# Patient Record
Sex: Male | Born: 1955
Health system: Southern US, Community
[De-identification: ages and names within clinical notes are randomized; demographics above are authoritative.]

## PROBLEM LIST (undated history)

## (undated) DIAGNOSIS — N3289 Other specified disorders of bladder: Secondary | ICD-10-CM

## (undated) DIAGNOSIS — I2510 Atherosclerotic heart disease of native coronary artery without angina pectoris: Secondary | ICD-10-CM

## (undated) DIAGNOSIS — E785 Hyperlipidemia, unspecified: Secondary | ICD-10-CM

## (undated) DIAGNOSIS — L409 Psoriasis, unspecified: Secondary | ICD-10-CM

## (undated) DIAGNOSIS — I219 Acute myocardial infarction, unspecified: Secondary | ICD-10-CM

## (undated) DIAGNOSIS — I10 Essential (primary) hypertension: Secondary | ICD-10-CM

## (undated) HISTORY — PX: JOINT REPLACEMENT: SHX530

## (undated) HISTORY — DX: Other specified disorders of bladder: N32.89

## (undated) HISTORY — DX: Psoriasis, unspecified: L40.9

## (undated) HISTORY — DX: Essential (primary) hypertension: I10

## (undated) HISTORY — PX: CORONARY ANGIOPLASTY WITH STENT PLACEMENT: SHX49

## (undated) HISTORY — DX: Hyperlipidemia, unspecified: E78.5

## (undated) HISTORY — PX: CYSTOSTOMY W/ BLADDER BIOPSY: SHX1431

## (undated) HISTORY — DX: Atherosclerotic heart disease of native coronary artery without angina pectoris: I25.10

---

## 2007-01-21 ENCOUNTER — Other Ambulatory Visit: Payer: Self-pay

## 2007-01-21 ENCOUNTER — Inpatient Hospital Stay: Payer: Self-pay | Admitting: Internal Medicine

## 2007-01-22 ENCOUNTER — Other Ambulatory Visit: Payer: Self-pay

## 2007-05-14 ENCOUNTER — Emergency Department: Payer: Self-pay | Admitting: Emergency Medicine

## 2007-05-24 ENCOUNTER — Emergency Department: Payer: Self-pay | Admitting: Emergency Medicine

## 2009-12-30 ENCOUNTER — Inpatient Hospital Stay: Payer: Self-pay | Admitting: Internal Medicine

## 2013-01-13 DIAGNOSIS — N3289 Other specified disorders of bladder: Secondary | ICD-10-CM

## 2013-01-13 HISTORY — DX: Other specified disorders of bladder: N32.89

## 2013-01-13 HISTORY — PX: PROSTATE SURGERY: SHX751

## 2013-09-21 ENCOUNTER — Ambulatory Visit: Payer: Self-pay | Admitting: Family Medicine

## 2013-09-29 ENCOUNTER — Ambulatory Visit: Payer: Self-pay | Admitting: Urology

## 2014-07-30 ENCOUNTER — Other Ambulatory Visit: Payer: Self-pay | Admitting: Family Medicine

## 2014-09-02 ENCOUNTER — Other Ambulatory Visit: Payer: Self-pay | Admitting: Family Medicine

## 2014-09-04 ENCOUNTER — Telehealth: Payer: Self-pay | Admitting: Family Medicine

## 2014-09-04 MED ORDER — ENALAPRIL MALEATE 10 MG PO TABS: 10.0000 mg | ORAL_TABLET | Freq: Two times a day (BID) | ORAL | Status: DC

## 2014-09-04 NOTE — Telephone Encounter (Signed)
Pt came in to get refill on enalapril. He is completely out and has scheduled appt for aug 30 for follow up. He would like it to go to [facility].

## 2014-09-08 DIAGNOSIS — I2510 Atherosclerotic heart disease of native coronary artery without angina pectoris: Secondary | ICD-10-CM | POA: Insufficient documentation

## 2014-09-08 DIAGNOSIS — I10 Essential (primary) hypertension: Secondary | ICD-10-CM | POA: Insufficient documentation

## 2014-09-08 DIAGNOSIS — L409 Psoriasis, unspecified: Secondary | ICD-10-CM | POA: Insufficient documentation

## 2014-09-08 DIAGNOSIS — E785 Hyperlipidemia, unspecified: Secondary | ICD-10-CM | POA: Insufficient documentation

## 2014-09-12 ENCOUNTER — Ambulatory Visit (INDEPENDENT_AMBULATORY_CARE_PROVIDER_SITE_OTHER): Payer: BLUE CROSS/BLUE SHIELD | Admitting: Family Medicine

## 2014-09-12 ENCOUNTER — Encounter: Payer: Self-pay | Admitting: Family Medicine

## 2014-09-12 VITALS — BP 131/82 | HR 65 | Temp 97.8°F | Ht 67.7 in | Wt 190.0 lb

## 2014-09-12 DIAGNOSIS — I10 Essential (primary) hypertension: Secondary | ICD-10-CM

## 2014-09-12 DIAGNOSIS — E785 Hyperlipidemia, unspecified: Secondary | ICD-10-CM | POA: Diagnosis not present

## 2014-09-12 DIAGNOSIS — Z0000 Encounter for general adult medical examination without abnormal findings: Secondary | ICD-10-CM

## 2014-09-12 DIAGNOSIS — I2510 Atherosclerotic heart disease of native coronary artery without angina pectoris: Secondary | ICD-10-CM

## 2014-09-12 DIAGNOSIS — I2583 Coronary atherosclerosis due to lipid rich plaque: Secondary | ICD-10-CM

## 2014-09-12 LAB — MICROSCOPIC EXAMINATION

## 2014-09-12 LAB — URINALYSIS, ROUTINE W REFLEX MICROSCOPIC
Bilirubin, UA: NEGATIVE
Leukocytes, UA: NEGATIVE
NITRITE UA: NEGATIVE
Specific Gravity, UA: 1.03 (ref 1.005–1.030)
Urobilinogen, Ur: 1 mg/dL (ref 0.2–1.0)
pH, UA: 5.5 (ref 5.0–7.5)

## 2014-09-12 MED ORDER — HYDROCHLOROTHIAZIDE 12.5 MG PO TABS: ORAL_TABLET | ORAL | Status: DC

## 2014-09-12 MED ORDER — BETAMETHASONE DIPROPIONATE 0.05 % EX CREA: TOPICAL_CREAM | Freq: Two times a day (BID) | CUTANEOUS | Status: DC

## 2014-09-12 MED ORDER — CLOPIDOGREL BISULFATE 75 MG PO TABS: 75.0000 mg | ORAL_TABLET | Freq: Every day | ORAL | Status: DC

## 2014-09-12 MED ORDER — FINASTERIDE 5 MG PO TABS: 5.0000 mg | ORAL_TABLET | Freq: Every day | ORAL | Status: DC

## 2014-09-12 MED ORDER — KETOCONAZOLE 2 % EX SHAM: 1.0000 "application " | MEDICATED_SHAMPOO | CUTANEOUS | Status: DC

## 2014-09-12 MED ORDER — SIMVASTATIN 40 MG PO TABS: 40.0000 mg | ORAL_TABLET | Freq: Every day | ORAL | Status: DC

## 2014-09-12 MED ORDER — ENALAPRIL MALEATE 10 MG PO TABS: 10.0000 mg | ORAL_TABLET | Freq: Every day | ORAL | Status: DC

## 2014-09-12 MED ORDER — BETAMETHASONE DIPROPIONATE AUG 0.05 % EX LOTN: TOPICAL_LOTION | Freq: Two times a day (BID) | CUTANEOUS | Status: DC

## 2014-09-12 MED ORDER — METOPROLOL TARTRATE 25 MG PO TABS: 12.5000 mg | ORAL_TABLET | Freq: Two times a day (BID) | ORAL | Status: DC

## 2014-09-12 NOTE — Assessment & Plan Note (Signed)
The current medical regimen is effective;  continue present plan and medications.  

## 2014-09-12 NOTE — Progress Notes (Signed)
BP 131/82 mmHg  Pulse 65  Temp(Src) 97.8 F (36.6 C)  Ht 5' 7.7" (1.72 m)  Wt 190 lb (86.183 kg)  BMI 29.13 kg/m2  SpO2 96%   Subjective:    Patient ID: Robert Zuniga, male    DOB: Mar 28, 1955, 59 y.o.   MRN: [id number]  HPI: Lott Seelbach is a 59 y.o. male  Chief Complaint  Patient presents with  . Annual Exam   Patient for physical today doing well taking medications without problems Having some petechial hemorrhages on the arms from Plavix and work but otherwise doing well no issues with medications Takes Tylenol PM at night for sleep which helps is wondering about different varieties which were discussed.  Relevant past medical, surgical, family and social history reviewed and updated as indicated. Interim medical history since our last visit reviewed. Allergies and medications reviewed and updated.  Review of Systems  Constitutional: Negative.   HENT: Negative.   Eyes: Negative.   Respiratory: Negative.   Cardiovascular: Negative.   Endocrine: Negative.   Musculoskeletal: Negative.   Skin: Negative.   Allergic/Immunologic: Negative.   Neurological: Negative.   Hematological: Negative.   Psychiatric/Behavioral: Negative.     Per HPI unless specifically indicated above     Objective:    BP 131/82 mmHg  Pulse 65  Temp(Src) 97.8 F (36.6 C)  Ht 5' 7.7" (1.72 m)  Wt 190 lb (86.183 kg)  BMI 29.13 kg/m2  SpO2 96%  Wt Readings from Last 3 Encounters:  09/12/14 190 lb (86.183 kg)  04/19/14 192 lb (87.091 kg)    Physical Exam  Constitutional: He is oriented to person, place, and time. He appears well-developed and well-nourished.  HENT:  Head: Normocephalic and atraumatic.  Right Ear: External ear normal.  Left Ear: External ear normal.  Eyes: Conjunctivae and EOM are normal. Pupils are equal, round, and reactive to light.  Neck: Normal range of motion. Neck supple.  Cardiovascular: Normal rate, regular rhythm, normal heart sounds and intact distal  pulses.   Pulmonary/Chest: Effort normal and breath sounds normal.  Abdominal: Soft. Bowel sounds are normal. There is no splenomegaly or hepatomegaly.  Genitourinary: Rectum normal, prostate normal and penis normal.  Musculoskeletal: Normal range of motion.  Neurological: He is alert and oriented to person, place, and time. He has normal reflexes.  Skin: No rash noted. No erythema.  Psychiatric: He has a normal mood and affect. His behavior is normal. Judgment and thought content normal.    No results found for this or any previous visit.    Assessment & Plan:   Problem List Items Addressed This Visit      Cardiovascular and Mediastinum   Hypertension - Primary    The current medical regimen is effective;  continue present plan and medications.       Relevant Medications   simvastatin (ZOCOR) 40 MG tablet   metoprolol tartrate (LOPRESSOR) 25 MG tablet   hydrochlorothiazide (HYDRODIURIL) 12.5 MG tablet   enalapril (VASOTEC) 10 MG tablet   Other Relevant Orders   Basic metabolic panel   CAD (coronary artery disease)    The current medical regimen is effective;  continue present plan and medications.       Relevant Medications   simvastatin (ZOCOR) 40 MG tablet   metoprolol tartrate (LOPRESSOR) 25 MG tablet   hydrochlorothiazide (HYDRODIURIL) 12.5 MG tablet   enalapril (VASOTEC) 10 MG tablet   clopidogrel (PLAVIX) 75 MG tablet   Other Relevant Orders   LP+ALT+AST Piccolo, Waived  Basic metabolic panel     Other   Hyperlipidemia    The current medical regimen is effective;  continue present plan and medications.       Relevant Medications   simvastatin (ZOCOR) 40 MG tablet   metoprolol tartrate (LOPRESSOR) 25 MG tablet   hydrochlorothiazide (HYDRODIURIL) 12.5 MG tablet   enalapril (VASOTEC) 10 MG tablet   Other Relevant Orders   LP+ALT+AST Piccolo, Waived    Other Visit Diagnoses    PE (physical exam), annual        Relevant Orders    Comprehensive metabolic  panel    Lipid panel    CBC with Differential/Platelet    TSH    Urinalysis, Routine w reflex microscopic (not at [facility])        Follow up plan: Return in about 6 months (around 03/13/2015), or if symptoms worsen or fail to improve, for med check.

## 2014-09-13 ENCOUNTER — Encounter: Payer: Self-pay | Admitting: Family Medicine

## 2014-09-13 LAB — CBC WITH DIFFERENTIAL/PLATELET
BASOS ABS: 0 10*3/uL (ref 0.0–0.2)
BASOS: 0 %
EOS (ABSOLUTE): 0.2 10*3/uL (ref 0.0–0.4)
Eos: 4 %
Hematocrit: 46.4 % (ref 37.5–51.0)
Hemoglobin: 16.2 g/dL (ref 12.6–17.7)
Immature Grans (Abs): 0 10*3/uL (ref 0.0–0.1)
Immature Granulocytes: 0 %
Lymphocytes Absolute: 1 10*3/uL (ref 0.7–3.1)
Lymphs: 26 %
MCH: 29.7 pg (ref 26.6–33.0)
MCHC: 34.9 g/dL (ref 31.5–35.7)
MCV: 85 fL (ref 79–97)
MONOS ABS: 0.4 10*3/uL (ref 0.1–0.9)
Monocytes: 10 %
NEUTROS ABS: 2.3 10*3/uL (ref 1.4–7.0)
NEUTROS PCT: 60 %
PLATELETS: 145 10*3/uL — AB (ref 150–379)
RBC: 5.46 x10E6/uL (ref 4.14–5.80)
RDW: 13 % (ref 12.3–15.4)
WBC: 3.8 10*3/uL (ref 3.4–10.8)

## 2014-09-13 LAB — LIPID PANEL
CHOL/HDL RATIO: 2.8 ratio (ref 0.0–5.0)
CHOLESTEROL TOTAL: 130 mg/dL (ref 100–199)
HDL: 47 mg/dL (ref >39)
LDL CALC: 70 mg/dL (ref 0–99)
TRIGLYCERIDES: 65 mg/dL (ref 0–149)
VLDL CHOLESTEROL CAL: 13 mg/dL (ref 5–40)

## 2014-09-13 LAB — COMPREHENSIVE METABOLIC PANEL
ALBUMIN: 4.2 g/dL (ref 3.6–4.8)
ALK PHOS: 95 IU/L (ref 39–117)
ALT: 22 IU/L (ref 0–44)
AST: 19 IU/L (ref 0–40)
Albumin/Globulin Ratio: 2 (ref 1.1–2.5)
BUN / CREAT RATIO: 17 (ref 10–22)
BUN: 12 mg/dL (ref 8–27)
Bilirubin Total: 0.8 mg/dL (ref 0.0–1.2)
CALCIUM: 9.3 mg/dL (ref 8.6–10.2)
CO2: 23 mmol/L (ref 18–29)
CREATININE: 0.71 mg/dL — AB (ref 0.76–1.27)
Chloride: 101 mmol/L (ref 97–108)
GFR calc Af Amer: 118 mL/min/{1.73_m2} (ref >59)
GFR, EST NON AFRICAN AMERICAN: 102 mL/min/{1.73_m2} (ref >59)
GLOBULIN, TOTAL: 2.1 g/dL (ref 1.5–4.5)
GLUCOSE: 109 mg/dL — AB (ref 65–99)
Potassium: 4.1 mmol/L (ref 3.5–5.2)
SODIUM: 142 mmol/L (ref 134–144)
Total Protein: 6.3 g/dL (ref 6.0–8.5)

## 2014-09-13 LAB — TSH: TSH: 0.886 u[IU]/mL (ref 0.450–4.500)

## 2014-11-29 ENCOUNTER — Other Ambulatory Visit: Payer: Self-pay | Admitting: Family Medicine

## 2015-03-14 ENCOUNTER — Ambulatory Visit (INDEPENDENT_AMBULATORY_CARE_PROVIDER_SITE_OTHER): Payer: BLUE CROSS/BLUE SHIELD | Admitting: Family Medicine

## 2015-03-14 ENCOUNTER — Encounter: Payer: Self-pay | Admitting: Family Medicine

## 2015-03-14 DIAGNOSIS — I10 Essential (primary) hypertension: Secondary | ICD-10-CM

## 2015-03-14 DIAGNOSIS — I2510 Atherosclerotic heart disease of native coronary artery without angina pectoris: Secondary | ICD-10-CM | POA: Diagnosis not present

## 2015-03-14 DIAGNOSIS — E785 Hyperlipidemia, unspecified: Secondary | ICD-10-CM

## 2015-03-14 DIAGNOSIS — I2583 Coronary atherosclerosis due to lipid rich plaque: Principal | ICD-10-CM

## 2015-03-14 LAB — LP+ALT+AST PICCOLO, WAIVED
ALT (SGPT) Piccolo, Waived: 31 U/L (ref 10–47)
AST (SGOT) PICCOLO, WAIVED: 37 U/L (ref 11–38)
CHOLESTEROL PICCOLO, WAIVED: 159 mg/dL (ref <200)
Chol/HDL Ratio Piccolo,Waive: 3.4 mg/dL
HDL CHOL PICCOLO, WAIVED: 47 mg/dL — AB (ref >59)
LDL CHOL CALC PICCOLO WAIVED: 87 mg/dL (ref <100)
Triglycerides Piccolo,Waived: 126 mg/dL (ref <150)
VLDL Chol Calc Piccolo,Waive: 25 mg/dL (ref <30)

## 2015-03-14 MED ORDER — SIMVASTATIN 40 MG PO TABS: ORAL_TABLET | ORAL | Status: DC

## 2015-03-14 NOTE — Assessment & Plan Note (Signed)
The current medical regimen is effective;  continue present plan and medications.  

## 2015-03-14 NOTE — Progress Notes (Signed)
BP 103/70 mmHg  Pulse 61  Temp(Src) 98.2 F (36.8 C)  Ht 5' 7.3" (1.709 m)  Wt 189 lb (85.73 kg)  BMI 29.35 kg/m2  SpO2 99%   Subjective:    Patient ID: Robert Zuniga, male    DOB: 03-06-1955, 60 y.o.   MRN: [id number]  HPI: Robert Zuniga is a 60 y.o. male  Chief Complaint  Patient presents with  . Hypertension  . Hyperlipidemia  . Coronary Artery Disease   Recheck medical problems no problems with blood pressure good control no side effects Cholesterol doing well with no problems issues No chest pain chest tightness or other cardiovascular type symptoms takes medications faithfully without side effects  Relevant past medical, surgical, family and social history reviewed and updated as indicated. Interim medical history since our last visit reviewed. Allergies and medications reviewed and updated.  Review of Systems  Constitutional: Negative.   Respiratory: Negative.   Cardiovascular: Negative.     Per HPI unless specifically indicated above     Objective:    BP 103/70 mmHg  Pulse 61  Temp(Src) 98.2 F (36.8 C)  Ht 5' 7.3" (1.709 m)  Wt 189 lb (85.73 kg)  BMI 29.35 kg/m2  SpO2 99%  Wt Readings from Last 3 Encounters:  03/14/15 189 lb (85.73 kg)  09/12/14 190 lb (86.183 kg)  04/19/14 192 lb (87.091 kg)    Physical Exam  Constitutional: He is oriented to person, place, and time. He appears well-developed and well-nourished. No distress.  HENT:  Head: Normocephalic and atraumatic.  Right Ear: Hearing normal.  Left Ear: Hearing normal.  Nose: Nose normal.  Eyes: Conjunctivae and lids are normal. Right eye exhibits no discharge. Left eye exhibits no discharge. No scleral icterus.  Cardiovascular: Normal rate, regular rhythm and normal heart sounds.   Pulmonary/Chest: Effort normal and breath sounds normal. No respiratory distress.  Musculoskeletal: Normal range of motion.  Neurological: He is alert and oriented to person, place, and time.  Skin: Skin is  intact. No rash noted.  Psychiatric: He has a normal mood and affect. His speech is normal and behavior is normal. Judgment and thought content normal. Cognition and memory are normal.    Results for orders placed or performed in visit on 03/14/15  LP+ALT+AST Piccolo, [employer]  Result Value Ref Range   ALT (SGPT) Piccolo, Waived 31 10 - 47 U/L   AST (SGOT) Piccolo, Waived 37 11 - 38 U/L   Cholesterol Piccolo, Waived 159 <200 mg/dL   HDL Chol Piccolo, Waived 47 (L) >59 mg/dL   Triglycerides Piccolo,Waived 126 <150 mg/dL   Chol/HDL Ratio Piccolo,Waive 3.4 mg/dL   LDL Chol Calc Piccolo Waived 87 <100 mg/dL   VLDL Chol Calc Piccolo,Waive 25 <30 mg/dL      Assessment & Plan:   Problem List Items Addressed This Visit      Cardiovascular and Mediastinum   Hypertension    The current medical regimen is effective;  continue present plan and medications.       Relevant Medications   simvastatin (ZOCOR) 40 MG tablet   CAD (coronary artery disease)    The current medical regimen is effective;  continue present plan and medications.       Relevant Medications   simvastatin (ZOCOR) 40 MG tablet     Other   Hyperlipidemia    The current medical regimen is effective;  continue present plan and medications.       Relevant Medications   simvastatin (ZOCOR) 40 MG  tablet       Follow up plan: Return in about 6 months (around 09/14/2015) for Physical Exam.

## 2015-03-15 ENCOUNTER — Encounter: Payer: Self-pay | Admitting: Family Medicine

## 2015-03-15 LAB — BASIC METABOLIC PANEL
BUN / CREAT RATIO: 18 (ref 10–22)
BUN: 15 mg/dL (ref 8–27)
CHLORIDE: 100 mmol/L (ref 96–106)
CO2: 27 mmol/L (ref 18–29)
CREATININE: 0.83 mg/dL (ref 0.76–1.27)
Calcium: 9.8 mg/dL (ref 8.6–10.2)
GFR calc non Af Amer: 96 mL/min/{1.73_m2} (ref >59)
GFR, EST AFRICAN AMERICAN: 111 mL/min/{1.73_m2} (ref >59)
Glucose: 101 mg/dL — ABNORMAL HIGH (ref 65–99)
POTASSIUM: 4.4 mmol/L (ref 3.5–5.2)
Sodium: 142 mmol/L (ref 134–144)

## 2015-06-30 ENCOUNTER — Other Ambulatory Visit: Payer: Self-pay | Admitting: Family Medicine

## 2015-08-01 ENCOUNTER — Other Ambulatory Visit: Payer: Self-pay | Admitting: Family Medicine

## 2015-09-05 ENCOUNTER — Encounter (INDEPENDENT_AMBULATORY_CARE_PROVIDER_SITE_OTHER): Payer: Self-pay

## 2015-09-23 ENCOUNTER — Other Ambulatory Visit: Payer: Self-pay | Admitting: Family Medicine

## 2015-09-23 DIAGNOSIS — I10 Essential (primary) hypertension: Secondary | ICD-10-CM

## 2015-10-04 ENCOUNTER — Encounter: Payer: BLUE CROSS/BLUE SHIELD | Admitting: Family Medicine

## 2015-10-16 ENCOUNTER — Other Ambulatory Visit: Payer: Self-pay | Admitting: Family Medicine

## 2015-10-16 DIAGNOSIS — I2583 Coronary atherosclerosis due to lipid rich plaque: Principal | ICD-10-CM

## 2015-10-16 DIAGNOSIS — I2510 Atherosclerotic heart disease of native coronary artery without angina pectoris: Secondary | ICD-10-CM

## 2015-10-16 NOTE — Telephone Encounter (Signed)
apt 

## 2015-10-17 ENCOUNTER — Encounter: Payer: Self-pay | Admitting: Family Medicine

## 2015-10-17 NOTE — Telephone Encounter (Signed)
MAC pt 

## 2015-10-17 NOTE — Telephone Encounter (Signed)
Letter sent.

## 2015-10-25 ENCOUNTER — Encounter: Payer: BLUE CROSS/BLUE SHIELD | Admitting: Family Medicine

## 2015-11-06 ENCOUNTER — Encounter: Payer: Self-pay | Admitting: Family Medicine

## 2015-11-06 ENCOUNTER — Ambulatory Visit (INDEPENDENT_AMBULATORY_CARE_PROVIDER_SITE_OTHER): Payer: BLUE CROSS/BLUE SHIELD | Admitting: Family Medicine

## 2015-11-06 VITALS — BP 135/84 | HR 67 | Temp 98.1°F | Ht 67.5 in | Wt 194.0 lb

## 2015-11-06 DIAGNOSIS — N400 Enlarged prostate without lower urinary tract symptoms: Secondary | ICD-10-CM

## 2015-11-06 DIAGNOSIS — I10 Essential (primary) hypertension: Secondary | ICD-10-CM

## 2015-11-06 DIAGNOSIS — Z0000 Encounter for general adult medical examination without abnormal findings: Secondary | ICD-10-CM

## 2015-11-06 DIAGNOSIS — J019 Acute sinusitis, unspecified: Secondary | ICD-10-CM

## 2015-11-06 DIAGNOSIS — E782 Mixed hyperlipidemia: Secondary | ICD-10-CM

## 2015-11-06 DIAGNOSIS — I2583 Coronary atherosclerosis due to lipid rich plaque: Secondary | ICD-10-CM | POA: Diagnosis not present

## 2015-11-06 DIAGNOSIS — I2510 Atherosclerotic heart disease of native coronary artery without angina pectoris: Secondary | ICD-10-CM | POA: Diagnosis not present

## 2015-11-06 LAB — MICROSCOPIC EXAMINATION

## 2015-11-06 LAB — URINALYSIS, ROUTINE W REFLEX MICROSCOPIC
Bilirubin, UA: NEGATIVE
KETONES UA: NEGATIVE
LEUKOCYTES UA: NEGATIVE
NITRITE UA: NEGATIVE
SPEC GRAV UA: 1.025 (ref 1.005–1.030)
Urobilinogen, Ur: 1 mg/dL (ref 0.2–1.0)
pH, UA: 6 (ref 5.0–7.5)

## 2015-11-06 MED ORDER — KETOCONAZOLE 2 % EX SHAM: 1.0000 "application " | MEDICATED_SHAMPOO | CUTANEOUS | 7 refills | Status: DC

## 2015-11-06 MED ORDER — ENALAPRIL MALEATE 10 MG PO TABS: 10.0000 mg | ORAL_TABLET | Freq: Every day | ORAL | 12 refills | Status: DC

## 2015-11-06 MED ORDER — BETAMETHASONE DIPROPIONATE 0.05 % EX CREA: TOPICAL_CREAM | Freq: Two times a day (BID) | CUTANEOUS | 7 refills | Status: DC

## 2015-11-06 MED ORDER — BETAMETHASONE DIPROPIONATE 0.05 % EX LOTN: TOPICAL_LOTION | CUTANEOUS | 2 refills | Status: DC

## 2015-11-06 MED ORDER — AZITHROMYCIN 250 MG PO TABS: ORAL_TABLET | ORAL | 0 refills | Status: DC

## 2015-11-06 MED ORDER — HYDROCHLOROTHIAZIDE 12.5 MG PO TABS: 12.5000 mg | ORAL_TABLET | Freq: Every day | ORAL | 12 refills | Status: DC

## 2015-11-06 MED ORDER — CLOPIDOGREL BISULFATE 75 MG PO TABS: 75.0000 mg | ORAL_TABLET | Freq: Every day | ORAL | 12 refills | Status: DC

## 2015-11-06 MED ORDER — SIMVASTATIN 40 MG PO TABS: ORAL_TABLET | ORAL | 12 refills | Status: DC

## 2015-11-06 MED ORDER — FINASTERIDE 5 MG PO TABS: 5.0000 mg | ORAL_TABLET | Freq: Every day | ORAL | 12 refills | Status: DC

## 2015-11-06 MED ORDER — METOPROLOL TARTRATE 25 MG PO TABS: 12.5000 mg | ORAL_TABLET | Freq: Two times a day (BID) | ORAL | 12 refills | Status: DC

## 2015-11-06 NOTE — Progress Notes (Signed)
BP 135/84   Pulse 67   Temp 98.1 F (36.7 C)   Ht 5' 7.5" (1.715 m)   Wt 194 lb (88 kg)   SpO2 98%   BMI 29.94 kg/m    Subjective:    Patient ID: Robert Zuniga, male    DOB: 29-Jun-1955, 60 y.o.   MRN: [id number]  HPI: Robert Zuniga is a 60 y.o. male  Chief Complaint  Patient presents with  . Annual Exam  Patient with over a week of head cold congestion drainage feeling facial pressure and bad some intermittent fevers. This not getting better has tried over-the-counter medications with no relief  Patient also follow-up cholesterol was taking Niaspan broke out in whelps 1 night and stopped it. Has had no more whelps since.  Blood pressure doing well no complaints with medications  BPH doing well no complaints with medications Takes medications faithfully.  Relevant past medical, surgical, family and social history reviewed and updated as indicated. Interim medical history since our last visit reviewed. Allergies and medications reviewed and updated.  Review of Systems  Constitutional: Positive for chills, diaphoresis, fatigue and fever.  HENT: Positive for congestion, postnasal drip, rhinorrhea, sinus pressure, sneezing and sore throat.   Eyes: Negative.   Respiratory: Positive for cough. Negative for shortness of breath and wheezing.   Cardiovascular: Negative.   Gastrointestinal: Negative.   Endocrine: Negative.   Genitourinary: Negative.   Musculoskeletal: Negative.   Skin: Negative.   Allergic/Immunologic: Negative.   Neurological: Negative.   Hematological: Negative.   Psychiatric/Behavioral: Negative.     Per HPI unless specifically indicated above     Objective:    BP 135/84   Pulse 67   Temp 98.1 F (36.7 C)   Ht 5' 7.5" (1.715 m)   Wt 194 lb (88 kg)   SpO2 98%   BMI 29.94 kg/m   Wt Readings from Last 3 Encounters:  11/06/15 194 lb (88 kg)  03/14/15 189 lb (85.7 kg)  09/12/14 190 lb (86.2 kg)    Physical Exam  Constitutional: He is  oriented to person, place, and time. He appears well-developed and well-nourished.  HENT:  Head: Normocephalic and atraumatic.  Right Ear: External ear normal.  Left Ear: External ear normal.  Mouth/Throat: Oropharyngeal exudate present.  Eyes: Conjunctivae and EOM are normal. Pupils are equal, round, and reactive to light.  Neck: Normal range of motion. Neck supple.  Cardiovascular: Normal rate, regular rhythm, normal heart sounds and intact distal pulses.   Pulmonary/Chest: Effort normal and breath sounds normal.  Abdominal: Soft. Bowel sounds are normal. There is no splenomegaly or hepatomegaly.  Genitourinary: Rectum normal and penis normal.  Genitourinary Comments: Prostate enlarged Lt ing hernia  Musculoskeletal: Normal range of motion.  Neurological: He is alert and oriented to person, place, and time. He has normal reflexes.  Skin: No rash noted. No erythema.  Psychiatric: He has a normal mood and affect. His behavior is normal. Judgment and thought content normal.    Results for orders placed or performed in visit on 03/14/15  LP+ALT+AST Piccolo, [employer]  Result Value Ref Range   ALT (SGPT) Piccolo, Waived 31 10 - 47 U/L   AST (SGOT) Piccolo, Waived 37 11 - 38 U/L   Cholesterol Piccolo, Waived 159 <200 mg/dL   HDL Chol Piccolo, Waived 47 (L) >59 mg/dL   Triglycerides Piccolo,Waived 126 <150 mg/dL   Chol/HDL Ratio Piccolo,Waive 3.4 mg/dL   LDL Chol Calc Piccolo Waived 87 <100 mg/dL   VLDL Chol  Calc Piccolo,Waive 25 <30 mg/dL  Basic metabolic panel  Result Value Ref Range   Glucose 101 (H) 65 - 99 mg/dL   BUN 15 8 - 27 mg/dL   Creatinine, Ser 0.83 0.76 - 1.27 mg/dL   GFR calc non Af Amer 96 >59 mL/min/1.73   GFR calc Af Amer 111 >59 mL/min/1.73   BUN/Creatinine Ratio 18 10 - 22   Sodium 142 134 - 144 mmol/L   Potassium 4.4 3.5 - 5.2 mmol/L   Chloride 100 96 - 106 mmol/L   CO2 27 18 - 29 mmol/L   Calcium 9.8 8.6 - 10.2 mg/dL      Assessment & Plan:   Problem List  Items Addressed This Visit      Cardiovascular and Mediastinum   Hypertension    The current medical regimen is effective;  continue present plan and medications.       Relevant Medications   simvastatin (ZOCOR) 40 MG tablet   metoprolol tartrate (LOPRESSOR) 25 MG tablet   hydrochlorothiazide (HYDRODIURIL) 12.5 MG tablet   enalapril (VASOTEC) 10 MG tablet   Other Relevant Orders   CBC with Differential/Platelet   Comprehensive metabolic panel   Urinalysis, Routine w reflex microscopic (not at [facility])   CAD (coronary artery disease) - Primary    The current medical regimen is effective;  continue present plan and medications.       Relevant Medications   simvastatin (ZOCOR) 40 MG tablet   metoprolol tartrate (LOPRESSOR) 25 MG tablet   hydrochlorothiazide (HYDRODIURIL) 12.5 MG tablet   enalapril (VASOTEC) 10 MG tablet   clopidogrel (PLAVIX) 75 MG tablet   Other Relevant Orders   Comprehensive metabolic panel     Respiratory   Acute sinusitis   Relevant Medications   azithromycin (ZITHROMAX) 250 MG tablet     Genitourinary   BPH (benign prostatic hyperplasia)   Relevant Medications   finasteride (PROSCAR) 5 MG tablet     Other   Hyperlipidemia    The current medical regimen is effective;  continue present plan and medications.       Relevant Medications   simvastatin (ZOCOR) 40 MG tablet   metoprolol tartrate (LOPRESSOR) 25 MG tablet   hydrochlorothiazide (HYDRODIURIL) 12.5 MG tablet   enalapril (VASOTEC) 10 MG tablet   Other Relevant Orders   Lipid Profile    Other Visit Diagnoses    PE (physical exam), annual       Relevant Orders   CBC with Differential/Platelet   Comprehensive metabolic panel   Urinalysis, Routine w reflex microscopic (not at [facility])   Lipid Profile   TSH   PSA       Follow up plan: Return in about 6 months (around 05/06/2016) for BMP,  Lipids, ALT, AST.

## 2015-11-06 NOTE — Assessment & Plan Note (Signed)
The current medical regimen is effective;  continue present plan and medications.  

## 2015-11-07 ENCOUNTER — Encounter: Payer: Self-pay | Admitting: Family Medicine

## 2015-11-07 LAB — CBC WITH DIFFERENTIAL/PLATELET
Basophils Absolute: 0 10*3/uL (ref 0.0–0.2)
Basos: 1 %
EOS (ABSOLUTE): 0.1 10*3/uL (ref 0.0–0.4)
EOS: 3 %
Hematocrit: 44.1 % (ref 37.5–51.0)
Hemoglobin: 15.6 g/dL (ref 12.6–17.7)
IMMATURE GRANULOCYTES: 0 %
Immature Grans (Abs): 0 10*3/uL (ref 0.0–0.1)
Lymphocytes Absolute: 0.8 10*3/uL (ref 0.7–3.1)
Lymphs: 21 %
MCH: 29.4 pg (ref 26.6–33.0)
MCHC: 35.4 g/dL (ref 31.5–35.7)
MCV: 83 fL (ref 79–97)
MONOS ABS: 0.4 10*3/uL (ref 0.1–0.9)
Monocytes: 11 %
NEUTROS PCT: 64 %
Neutrophils Absolute: 2.5 10*3/uL (ref 1.4–7.0)
PLATELETS: 152 10*3/uL (ref 150–379)
RBC: 5.31 x10E6/uL (ref 4.14–5.80)
RDW: 13.3 % (ref 12.3–15.4)
WBC: 3.9 10*3/uL (ref 3.4–10.8)

## 2015-11-07 LAB — LIPID PANEL
Chol/HDL Ratio: 3 ratio units (ref 0.0–5.0)
Cholesterol, Total: 116 mg/dL (ref 100–199)
HDL: 39 mg/dL — ABNORMAL LOW (ref >39)
LDL Calculated: 66 mg/dL (ref 0–99)
TRIGLYCERIDES: 54 mg/dL (ref 0–149)
VLDL Cholesterol Cal: 11 mg/dL (ref 5–40)

## 2015-11-07 LAB — COMPREHENSIVE METABOLIC PANEL
ALT: 16 IU/L (ref 0–44)
AST: 19 IU/L (ref 0–40)
Albumin/Globulin Ratio: 1.8 (ref 1.2–2.2)
Albumin: 4.1 g/dL (ref 3.6–4.8)
Alkaline Phosphatase: 102 IU/L (ref 39–117)
BUN/Creatinine Ratio: 19 (ref 10–24)
BUN: 15 mg/dL (ref 8–27)
Bilirubin Total: 0.6 mg/dL (ref 0.0–1.2)
CALCIUM: 9.3 mg/dL (ref 8.6–10.2)
CO2: 25 mmol/L (ref 18–29)
CREATININE: 0.78 mg/dL (ref 0.76–1.27)
Chloride: 100 mmol/L (ref 96–106)
GFR calc Af Amer: 113 mL/min/{1.73_m2} (ref >59)
GFR, EST NON AFRICAN AMERICAN: 97 mL/min/{1.73_m2} (ref >59)
Globulin, Total: 2.3 g/dL (ref 1.5–4.5)
Glucose: 105 mg/dL — ABNORMAL HIGH (ref 65–99)
Potassium: 4 mmol/L (ref 3.5–5.2)
Sodium: 139 mmol/L (ref 134–144)
Total Protein: 6.4 g/dL (ref 6.0–8.5)

## 2015-11-07 LAB — PSA: PROSTATE SPECIFIC AG, SERUM: 1.2 ng/mL (ref 0.0–4.0)

## 2015-11-07 LAB — TSH: TSH: 1.6 u[IU]/mL (ref 0.450–4.500)

## 2015-11-08 ENCOUNTER — Ambulatory Visit
Admission: RE | Admit: 2015-11-08 | Discharge: 2015-11-08 | Disposition: A | Discharge status: Home / Self Care | Payer: BLUE CROSS/BLUE SHIELD | Source: Ambulatory Visit | Attending: Family Medicine | Admitting: Family Medicine

## 2015-11-08 ENCOUNTER — Encounter: Payer: Self-pay | Admitting: Family Medicine

## 2015-11-08 ENCOUNTER — Telehealth: Payer: Self-pay | Admitting: Family Medicine

## 2015-11-08 ENCOUNTER — Ambulatory Visit (INDEPENDENT_AMBULATORY_CARE_PROVIDER_SITE_OTHER): Payer: BLUE CROSS/BLUE SHIELD | Admitting: Family Medicine

## 2015-11-08 VITALS — BP 107/73 | HR 79 | Temp 100.3°F | Wt 196.0 lb

## 2015-11-08 DIAGNOSIS — R509 Fever, unspecified: Secondary | ICD-10-CM | POA: Diagnosis not present

## 2015-11-08 DIAGNOSIS — J069 Acute upper respiratory infection, unspecified: Secondary | ICD-10-CM

## 2015-11-08 DIAGNOSIS — R05 Cough: Secondary | ICD-10-CM | POA: Insufficient documentation

## 2015-11-08 DIAGNOSIS — R059 Cough, unspecified: Secondary | ICD-10-CM

## 2015-11-08 LAB — INFLUENZA A AND B
Influenza A Ag, EIA: NEGATIVE
Influenza B Ag, EIA: NEGATIVE

## 2015-11-08 LAB — PLEASE NOTE:

## 2015-11-08 MED ORDER — LEVOFLOXACIN 750 MG PO TABS: 750.0000 mg | ORAL_TABLET | Freq: Every day | ORAL | 0 refills | Status: DC

## 2015-11-08 MED ORDER — DM-GUAIFENESIN ER 30-600 MG PO TB12: 1.0000 | ORAL_TABLET | Freq: Two times a day (BID) | ORAL | 0 refills | Status: DC

## 2015-11-08 NOTE — Patient Instructions (Signed)
Follow up as needed

## 2015-11-08 NOTE — Telephone Encounter (Signed)
Please call pt and let him know that his chest x-ray was negative for pneumonia. Continue as planned with d/c of z-pak and start on the 5 day levaquin. Follow up if no improvement in the next few days, go to UC or ER if severely worsening over weekend.

## 2015-11-08 NOTE — Progress Notes (Signed)
   BP 107/73   Pulse 79   Temp 100.3 F (37.9 C)   Wt 196 lb (88.9 kg)   SpO2 98%   BMI 30.24 kg/m    Subjective:    Patient ID: Robert Zuniga, male    DOB: Nov 25, 1955, 60 y.o.   MRN: [id number]  HPI: Robert Zuniga is a 60 y.o. male  Chief Complaint  Patient presents with  . URI    was seen Monday, given Zpack and has gotten worse. Chills, body aches, Sinus congestion, cough, fever   About 2 weeks of sinus congestion, now having fever, chills, body aches, and worsening sinus congestion and cough. Was given a z-pak Monday during his physical but has gotten worse since starting it. Denies SOB, CP, urinary symptoms, or abdominal pain/N/V/D.   Relevant past medical, surgical, family and social history reviewed and updated as indicated. Interim medical history since our last visit reviewed. Allergies and medications reviewed and updated.  Review of Systems  Constitutional: Positive for chills, diaphoresis, fatigue and fever.  HENT: Positive for congestion and sinus pressure.   Eyes: Negative.   Respiratory: Positive for cough.   Cardiovascular: Negative.   Gastrointestinal: Negative.   Genitourinary: Negative.   Musculoskeletal: Positive for myalgias.  Neurological: Negative.   Psychiatric/Behavioral: Negative.     Per HPI unless specifically indicated above     Objective:    BP 107/73   Pulse 79   Temp 100.3 F (37.9 C)   Wt 196 lb (88.9 kg)   SpO2 98%   BMI 30.24 kg/m   Wt Readings from Last 3 Encounters:  11/08/15 196 lb (88.9 kg)  11/06/15 194 lb (88 kg)  03/14/15 189 lb (85.7 kg)    Physical Exam  Constitutional: He is oriented to person, place, and time. He appears well-developed and well-nourished. No distress.  HENT:  Head: Atraumatic.  Right Ear: External ear normal.  Left Ear: External ear normal.  Nose: Nose normal.  Mouth/Throat: No oropharyngeal exudate.  Oropharynx erythematous  Eyes: Conjunctivae are normal. Pupils are equal, round, and  reactive to light. No scleral icterus.  Neck: Normal range of motion. Neck supple.  Cardiovascular: Normal rate, regular rhythm and normal heart sounds.   Pulmonary/Chest: Effort normal and breath sounds normal. No respiratory distress. He has no wheezes. He has no rales.  Musculoskeletal: Normal range of motion.  Lymphadenopathy:    He has no cervical adenopathy.  Neurological: He is alert and oriented to person, place, and time.  Skin: Skin is warm and dry.  Psychiatric: He has a normal mood and affect. His behavior is normal.  Nursing note and vitals reviewed.     Assessment & Plan:   Problem List Items Addressed This Visit    None    Visit Diagnoses    Upper respiratory tract infection, unspecified type    -  Primary   Flu test negative, lungs sounded clear, but given fever and worsening course will get CXR and start on levaquin. Await results. Precautions given.    Relevant Orders   Influenza a and b   DG Chest 2 View       Follow up plan: Return if symptoms worsen or fail to improve.

## 2015-11-08 NOTE — Telephone Encounter (Signed)
Patients wife notified

## 2016-01-16 ENCOUNTER — Encounter: Payer: Self-pay | Admitting: Family Medicine

## 2016-01-16 ENCOUNTER — Ambulatory Visit (INDEPENDENT_AMBULATORY_CARE_PROVIDER_SITE_OTHER): Payer: BLUE CROSS/BLUE SHIELD | Admitting: Family Medicine

## 2016-01-16 VITALS — BP 150/88 | HR 82 | Temp 98.9°F | Wt 192.0 lb

## 2016-01-16 DIAGNOSIS — B9789 Other viral agents as the cause of diseases classified elsewhere: Secondary | ICD-10-CM

## 2016-01-16 DIAGNOSIS — J069 Acute upper respiratory infection, unspecified: Secondary | ICD-10-CM | POA: Diagnosis not present

## 2016-01-16 DIAGNOSIS — R509 Fever, unspecified: Secondary | ICD-10-CM | POA: Diagnosis not present

## 2016-01-16 LAB — VERITOR FLU A/B WAIVED
INFLUENZA B: NEGATIVE
Influenza A: NEGATIVE

## 2016-01-16 MED ORDER — HYDROCOD POLST-CPM POLST ER 10-8 MG/5ML PO SUER: 5.0000 mL | Freq: Two times a day (BID) | ORAL | 0 refills | Status: DC | PRN

## 2016-01-16 MED ORDER — BENZONATATE 100 MG PO CAPS: 200.0000 mg | ORAL_CAPSULE | Freq: Three times a day (TID) | ORAL | 0 refills | Status: DC | PRN

## 2016-01-16 NOTE — Progress Notes (Signed)
BP (!) 150/88   Pulse 82   Temp 98.9 F (37.2 C)   Wt 192 lb (87.1 kg)   SpO2 98%   BMI 29.63 kg/m    Subjective:    Patient ID: Robert Zuniga, male    DOB: 1955-09-29, 61 y.o.   MRN: [id number]  HPI: Hagop Makely is a 61 y.o. male  Chief Complaint  Patient presents with  . URI    x 3 days. Chills, Fever, achey, chest congestion, headache, cough, sore throat. No ear ache.   Patient presents with 100 degree fevers, body aches, chest congestion, HA, productive cough, and sore throat. Denies CP, SOB, wheezing, ear pain. Taking mucinex and OTC cold medication. No sick contacts.   Past Medical History:  Diagnosis Date  . Bladder hemorrhage 2015  . CAD (coronary artery disease)   . Hyperlipidemia   . Hypertension   . Psoriasis    Social History   Social History  . Marital status: Married    Spouse name: N/A  . Number of children: N/A  . Years of education: N/A   Occupational History  . Not on file.   Social History Main Topics  . Smoking status: Former Smoker    Types: Cigarettes    Quit date: 09/12/1987  . Smokeless tobacco: Never Used  . Alcohol use No  . Drug use: No  . Sexual activity: Not on file   Other Topics Concern  . Not on file   Social History Narrative  . No narrative on file    Relevant past medical, surgical, family and social history reviewed and updated as indicated. Interim medical history since our last visit reviewed. Allergies and medications reviewed and updated.  Review of Systems  Constitutional: Positive for fever.  HENT: Positive for congestion.   Eyes: Negative.   Respiratory: Positive for cough.   Gastrointestinal: Negative.   Genitourinary: Negative.   Musculoskeletal: Positive for myalgias.  Neurological: Positive for headaches.  Psychiatric/Behavioral: Negative.     Per HPI unless specifically indicated above     Objective:    BP (!) 150/88   Pulse 82   Temp 98.9 F (37.2 C)   Wt 192 lb (87.1 kg)   SpO2 98%    BMI 29.63 kg/m   Wt Readings from Last 3 Encounters:  01/16/16 192 lb (87.1 kg)  11/08/15 196 lb (88.9 kg)  11/06/15 194 lb (88 kg)    Physical Exam  Constitutional: He is oriented to person, place, and time. He appears well-developed and well-nourished. No distress.  HENT:  Head: Atraumatic.  Right Ear: External ear normal.  Left Ear: External ear normal.  Mouth/Throat: No oropharyngeal exudate.  Oropharynx erythematous Nasal mucosa injected  Eyes: Conjunctivae are normal. Pupils are equal, round, and reactive to light.  Neck: Normal range of motion. Neck supple.  Cardiovascular: Normal rate and normal heart sounds.   Pulmonary/Chest: Effort normal and breath sounds normal.  Musculoskeletal: Normal range of motion.  Lymphadenopathy:    He has no cervical adenopathy.  Neurological: He is alert and oriented to person, place, and time.  Skin: Skin is warm and dry.  Psychiatric: He has a normal mood and affect. His behavior is normal.  Nursing note and vitals reviewed.     Assessment & Plan:   Problem List Items Addressed This Visit    None    Visit Diagnoses    Viral URI with cough    -  Primary   Rapid flu negative. Continue OTC  decongestants, supportive care. Tessalon and tussionex sent. Follow up if no improvement.    Relevant Orders   Influenza A & B (STAT) (Completed)       Follow up plan: No Follow-up on file.

## 2016-01-16 NOTE — Patient Instructions (Signed)
Follow up as needed

## 2016-01-17 ENCOUNTER — Encounter: Payer: Self-pay | Admitting: Family Medicine

## 2016-01-17 ENCOUNTER — Other Ambulatory Visit: Payer: Self-pay | Admitting: Family Medicine

## 2016-02-14 ENCOUNTER — Emergency Department: Payer: BLUE CROSS/BLUE SHIELD

## 2016-02-14 ENCOUNTER — Encounter: Payer: Self-pay | Admitting: Emergency Medicine

## 2016-02-14 ENCOUNTER — Observation Stay
Admission: EM | Admit: 2016-02-14 | Discharge: 2016-02-15 | Disposition: A | Discharge status: Home / Self Care | Payer: BLUE CROSS/BLUE SHIELD | Attending: Specialist | Admitting: Specialist

## 2016-02-14 DIAGNOSIS — Z7982 Long term (current) use of aspirin: Secondary | ICD-10-CM | POA: Diagnosis not present

## 2016-02-14 DIAGNOSIS — Z8249 Family history of ischemic heart disease and other diseases of the circulatory system: Secondary | ICD-10-CM | POA: Diagnosis not present

## 2016-02-14 DIAGNOSIS — L409 Psoriasis, unspecified: Secondary | ICD-10-CM | POA: Diagnosis not present

## 2016-02-14 DIAGNOSIS — Z833 Family history of diabetes mellitus: Secondary | ICD-10-CM | POA: Diagnosis not present

## 2016-02-14 DIAGNOSIS — Z79899 Other long term (current) drug therapy: Secondary | ICD-10-CM | POA: Diagnosis not present

## 2016-02-14 DIAGNOSIS — Z96643 Presence of artificial hip joint, bilateral: Secondary | ICD-10-CM | POA: Insufficient documentation

## 2016-02-14 DIAGNOSIS — Z818 Family history of other mental and behavioral disorders: Secondary | ICD-10-CM | POA: Insufficient documentation

## 2016-02-14 DIAGNOSIS — I10 Essential (primary) hypertension: Secondary | ICD-10-CM | POA: Insufficient documentation

## 2016-02-14 DIAGNOSIS — Z7902 Long term (current) use of antithrombotics/antiplatelets: Secondary | ICD-10-CM | POA: Diagnosis not present

## 2016-02-14 DIAGNOSIS — N400 Enlarged prostate without lower urinary tract symptoms: Secondary | ICD-10-CM | POA: Diagnosis not present

## 2016-02-14 DIAGNOSIS — Z955 Presence of coronary angioplasty implant and graft: Secondary | ICD-10-CM | POA: Diagnosis not present

## 2016-02-14 DIAGNOSIS — R0789 Other chest pain: Secondary | ICD-10-CM | POA: Diagnosis not present

## 2016-02-14 DIAGNOSIS — I2510 Atherosclerotic heart disease of native coronary artery without angina pectoris: Secondary | ICD-10-CM | POA: Diagnosis not present

## 2016-02-14 DIAGNOSIS — R0602 Shortness of breath: Secondary | ICD-10-CM | POA: Diagnosis not present

## 2016-02-14 DIAGNOSIS — I252 Old myocardial infarction: Secondary | ICD-10-CM | POA: Diagnosis not present

## 2016-02-14 DIAGNOSIS — Z887 Allergy status to serum and vaccine status: Secondary | ICD-10-CM | POA: Diagnosis not present

## 2016-02-14 DIAGNOSIS — Z87891 Personal history of nicotine dependence: Secondary | ICD-10-CM | POA: Insufficient documentation

## 2016-02-14 DIAGNOSIS — E785 Hyperlipidemia, unspecified: Secondary | ICD-10-CM | POA: Insufficient documentation

## 2016-02-14 DIAGNOSIS — I25119 Atherosclerotic heart disease of native coronary artery with unspecified angina pectoris: Secondary | ICD-10-CM | POA: Diagnosis not present

## 2016-02-14 DIAGNOSIS — R079 Chest pain, unspecified: Secondary | ICD-10-CM | POA: Diagnosis not present

## 2016-02-14 DIAGNOSIS — Z881 Allergy status to other antibiotic agents status: Secondary | ICD-10-CM | POA: Insufficient documentation

## 2016-02-14 DIAGNOSIS — Z791 Long term (current) use of non-steroidal anti-inflammatories (NSAID): Secondary | ICD-10-CM | POA: Insufficient documentation

## 2016-02-14 DIAGNOSIS — I200 Unstable angina: Secondary | ICD-10-CM | POA: Diagnosis not present

## 2016-02-14 DIAGNOSIS — Z885 Allergy status to narcotic agent status: Secondary | ICD-10-CM | POA: Diagnosis not present

## 2016-02-14 HISTORY — DX: Acute myocardial infarction, unspecified: I21.9

## 2016-02-14 LAB — COMPREHENSIVE METABOLIC PANEL
ALK PHOS: 93 U/L (ref 38–126)
ALT: 22 U/L (ref 17–63)
ANION GAP: 7 (ref 5–15)
AST: 24 U/L (ref 15–41)
Albumin: 4.1 g/dL (ref 3.5–5.0)
BILIRUBIN TOTAL: 1 mg/dL (ref 0.3–1.2)
BUN: 14 mg/dL (ref 6–20)
CALCIUM: 9.3 mg/dL (ref 8.9–10.3)
CO2: 25 mmol/L (ref 22–32)
CREATININE: 0.72 mg/dL (ref 0.61–1.24)
Chloride: 105 mmol/L (ref 101–111)
Glucose, Bld: 125 mg/dL — ABNORMAL HIGH (ref 65–99)
Potassium: 3.9 mmol/L (ref 3.5–5.1)
Sodium: 137 mmol/L (ref 135–145)
TOTAL PROTEIN: 6.8 g/dL (ref 6.5–8.1)

## 2016-02-14 LAB — CBC
HEMATOCRIT: 46.9 % (ref 40.0–52.0)
Hemoglobin: 16 g/dL (ref 13.0–18.0)
MCH: 29.2 pg (ref 26.0–34.0)
MCHC: 34.1 g/dL (ref 32.0–36.0)
MCV: 85.4 fL (ref 80.0–100.0)
Platelets: 112 10*3/uL — ABNORMAL LOW (ref 150–440)
RBC: 5.49 MIL/uL (ref 4.40–5.90)
RDW: 13.6 % (ref 11.5–14.5)
WBC: 7.8 10*3/uL (ref 3.8–10.6)

## 2016-02-14 LAB — TROPONIN I
Troponin I: <0.03 ng/mL (ref <0.03)
Troponin I: <0.03 ng/mL (ref <0.03)
Troponin I: <0.03 ng/mL (ref <0.03)
Troponin I: <0.03 ng/mL (ref <0.03)

## 2016-02-14 MED ORDER — ENOXAPARIN SODIUM 40 MG/0.4ML ~~LOC~~ SOLN
40.0000 mg | SUBCUTANEOUS | Status: DC
  Administered 2016-02-14: 40 mg via SUBCUTANEOUS
  Filled 2016-02-14: qty 0.4

## 2016-02-14 MED ORDER — ACETAMINOPHEN 325 MG PO TABS
650.0000 mg | ORAL_TABLET | Freq: Four times a day (QID) | ORAL | Status: DC | PRN
Start: 2016-02-14 — End: 2016-02-15
  Administered 2016-02-14 (×2): 650 mg via ORAL
  Filled 2016-02-14 (×2): qty 2

## 2016-02-14 MED ORDER — SODIUM CHLORIDE 0.9% FLUSH
3.0000 mL | Freq: Two times a day (BID) | INTRAVENOUS | Status: DC
  Administered 2016-02-14 – 2016-02-15 (×3): 3 mL via INTRAVENOUS

## 2016-02-14 MED ORDER — HYDROCHLOROTHIAZIDE 25 MG PO TABS
12.5000 mg | ORAL_TABLET | Freq: Every day | ORAL | Status: DC
Start: 2016-02-14 — End: 2016-02-15
  Administered 2016-02-14 – 2016-02-15 (×2): 12.5 mg via ORAL
  Filled 2016-02-14: qty 2
  Filled 2016-02-14: qty 1

## 2016-02-14 MED ORDER — ONDANSETRON HCL 4 MG PO TABS
4.0000 mg | ORAL_TABLET | Freq: Four times a day (QID) | ORAL | Status: DC | PRN
Start: 2016-02-14 — End: 2016-02-15

## 2016-02-14 MED ORDER — FINASTERIDE 5 MG PO TABS
5.0000 mg | ORAL_TABLET | Freq: Every day | ORAL | Status: DC
  Administered 2016-02-14: 5 mg via ORAL
  Filled 2016-02-14: qty 1

## 2016-02-14 MED ORDER — ALUM & MAG HYDROXIDE-SIMETH 200-200-20 MG/5ML PO SUSP: 30.0000 mL | Freq: Every day | ORAL | Status: DC | PRN

## 2016-02-14 MED ORDER — NITROGLYCERIN 2 % TD OINT
1.0000 [in_us] | TOPICAL_OINTMENT | Freq: Once | TRANSDERMAL | Status: AC
  Administered 2016-02-14: 1 [in_us] via TOPICAL
  Filled 2016-02-14: qty 1

## 2016-02-14 MED ORDER — ACETAMINOPHEN 650 MG RE SUPP
650.0000 mg | Freq: Four times a day (QID) | RECTAL | Status: DC | PRN
Start: 2016-02-14 — End: 2016-02-15

## 2016-02-14 MED ORDER — ONDANSETRON HCL 4 MG/2ML IJ SOLN: 4.0000 mg | Freq: Four times a day (QID) | INTRAMUSCULAR | Status: DC | PRN

## 2016-02-14 MED ORDER — ASPIRIN EC 81 MG PO TBEC
81.0000 mg | DELAYED_RELEASE_TABLET | Freq: Every day | ORAL | Status: DC
  Administered 2016-02-15: 81 mg via ORAL
  Filled 2016-02-14: qty 1

## 2016-02-14 MED ORDER — POLYETHYLENE GLYCOL 3350 17 G PO PACK
17.0000 g | PACK | Freq: Every day | ORAL | Status: DC
  Administered 2016-02-14 – 2016-02-15 (×2): 17 g via ORAL
  Filled 2016-02-14 (×2): qty 1

## 2016-02-14 MED ORDER — METOPROLOL TARTRATE 25 MG PO TABS
12.5000 mg | ORAL_TABLET | Freq: Two times a day (BID) | ORAL | Status: DC
  Administered 2016-02-14 – 2016-02-15 (×3): 12.5 mg via ORAL
  Filled 2016-02-14 (×2): qty 1
  Filled 2016-02-14: qty 2

## 2016-02-14 MED ORDER — GUAIFENESIN ER 600 MG PO TB12
600.0000 mg | ORAL_TABLET | Freq: Two times a day (BID) | ORAL | Status: DC
  Administered 2016-02-14 – 2016-02-15 (×2): 600 mg via ORAL
  Filled 2016-02-14 (×2): qty 1

## 2016-02-14 MED ORDER — FLUTICASONE PROPIONATE 50 MCG/ACT NA SUSP
2.0000 | Freq: Every day | NASAL | Status: DC
  Administered 2016-02-14 – 2016-02-15 (×2): 2 via NASAL
  Filled 2016-02-14: qty 16

## 2016-02-14 MED ORDER — ENOXAPARIN SODIUM 100 MG/ML ~~LOC~~ SOLN
1.0000 mg/kg | Freq: Once | SUBCUTANEOUS | Status: AC
  Administered 2016-02-14: 85 mg via SUBCUTANEOUS

## 2016-02-14 MED ORDER — SIMVASTATIN 40 MG PO TABS
40.0000 mg | ORAL_TABLET | Freq: Every day | ORAL | Status: DC
  Administered 2016-02-14: 40 mg via ORAL
  Filled 2016-02-14: qty 1

## 2016-02-14 MED ORDER — ENALAPRIL MALEATE 10 MG PO TABS
10.0000 mg | ORAL_TABLET | Freq: Every day | ORAL | Status: DC
  Administered 2016-02-14 – 2016-02-15 (×2): 10 mg via ORAL
  Filled 2016-02-14 (×2): qty 1

## 2016-02-14 MED ORDER — NITROGLYCERIN 0.4 MG SL SUBL: 0.4000 mg | SUBLINGUAL_TABLET | SUBLINGUAL | Status: DC | PRN

## 2016-02-14 MED ORDER — CLOPIDOGREL BISULFATE 75 MG PO TABS
75.0000 mg | ORAL_TABLET | Freq: Every day | ORAL | Status: DC
  Administered 2016-02-14 – 2016-02-15 (×2): 75 mg via ORAL
  Filled 2016-02-14 (×2): qty 1

## 2016-02-14 NOTE — Progress Notes (Signed)
Pt complaining of feeling "congested and stuffy", requesting RN to call MD.  Dr. Verdell Carmine paged and made aware of pt's status, new orders received.

## 2016-02-14 NOTE — H&P (Signed)
Robert Zuniga at [place] NAME: Robert Zuniga    MR#:  [id number]  DATE OF BIRTH:  1955-04-16  DATE OF ADMISSION:  02/14/2016  PRIMARY CARE PHYSICIAN: Golden Pop, MD   REQUESTING/REFERRING PHYSICIAN: Dr. Lenise Arena  CHIEF COMPLAINT:   Chief Complaint  Patient presents with  . Chest Pain    HISTORY OF PRESENT ILLNESS:  Robert Zuniga  is a 61 y.o. male with a known history of Coronary artery disease status post stent,  Hypertension, hyperlipidemia, history of psoriasis who presents to the hospital due to chest pain. Patient says that he went to work this morning around 5:30 AM and developed some right arm pain that radiated to his left arm and into the center of his chest. He also had associated shortness of breath and diaphoresis with it. He denies any nausea vomiting palpitations syncope. Patient says his symptoms are very similar to when he had his previous heart attack in 2005 and 2009 when he had to have cardiac stents placed. He was therefore but concerning came to the ER for further evaluation. Patient pain scale was about 8 out of 10 earlier this morning and currently down to about a 4. Hospitalist services were contacted further treatment and evaluation.  PAST MEDICAL HISTORY:   Past Medical History:  Diagnosis Date  . Bladder hemorrhage 2015  . CAD (coronary artery disease)   . Hyperlipidemia   . Hypertension   . MI (myocardial infarction)    X3  . Psoriasis     PAST SURGICAL HISTORY:   Past Surgical History:  Procedure Laterality Date  . CORONARY ANGIOPLASTY WITH STENT PLACEMENT    . CYSTOSTOMY W/ BLADDER BIOPSY    . JOINT REPLACEMENT Bilateral    hips  . PROSTATE SURGERY  2015   polyps -resolved    SOCIAL HISTORY:   Social History  Substance Use Topics  . Smoking status: Former Smoker    Packs/day: 1.00    Years: 2.00    Types: Cigarettes    Quit date: 09/12/1987  . Smokeless tobacco: Never Used  . Alcohol  use No    FAMILY HISTORY:   Family History  Problem Relation Age of Onset  . Diabetes Mother   . Dementia Mother   . Heart disease Father   . Cancer Sister   . Liver disease Sister   . Heart disease Maternal Grandfather   . Heart disease Paternal Grandfather     DRUG ALLERGIES:   Allergies  Allergen Reactions  . Morphine Nausea Only and Nausea And Vomiting  . Influenza Vaccine Recombinant Other (See Comments)    Joint pain  . Levaquin [Levofloxacin In D5w] Nausea And Vomiting  . Niaspan [Niacin Er] Hives    REVIEW OF SYSTEMS:   Review of Systems  Constitutional: Negative for fever and weight loss.  HENT: Negative for congestion, nosebleeds and tinnitus.   Eyes: Negative for blurred vision, double vision and redness.  Respiratory: Positive for shortness of breath. Negative for cough and hemoptysis.   Cardiovascular: Positive for chest pain. Negative for orthopnea, leg swelling and PND.  Gastrointestinal: Negative for abdominal pain, diarrhea, melena, nausea and vomiting.  Genitourinary: Negative for dysuria, hematuria and urgency.  Musculoskeletal: Negative for falls and joint pain.  Neurological: Negative for dizziness, tingling, sensory change, focal weakness, seizures, weakness and headaches.  Endo/Heme/Allergies: Negative for polydipsia. Does not bruise/bleed easily.  Psychiatric/Behavioral: Negative for depression and memory loss. The patient is not nervous/anxious.  MEDICATIONS AT HOME:   Prior to Admission medications   Medication Sig Start Date End Date Taking? Authorizing Provider  aspirin EC 81 MG tablet Take 81 mg by mouth daily.   Yes Historical Provider, MD  betamethasone dipropionate (DIPROLENE) 0.05 % cream Apply topically 2 (two) times daily. 11/06/15  Yes Guadalupe Maple, MD  betamethasone dipropionate 0.05 % lotion USE AS DIRECTED DONT USE FOR MORE THAN 2 WEEKS IN SAME AREA 11/06/15  Yes Guadalupe Maple, MD  calcium & magnesium carbonates  (MYLANTA) [id number] MG tablet Take 1 tablet by mouth daily.   Yes Historical Provider, MD  clopidogrel (PLAVIX) 75 MG tablet Take 1 tablet (75 mg total) by mouth daily. 11/06/15  Yes Guadalupe Maple, MD  enalapril (VASOTEC) 10 MG tablet Take 1 tablet (10 mg total) by mouth daily. 11/06/15  Yes Guadalupe Maple, MD  finasteride (PROSCAR) 5 MG tablet Take 1 tablet (5 mg total) by mouth at bedtime. Patient taking differently: Take 5 mg by mouth daily.  11/06/15  Yes Guadalupe Maple, MD  hydrochlorothiazide (HYDRODIURIL) 12.5 MG tablet Take 1 tablet (12.5 mg total) by mouth daily. 11/06/15  Yes Guadalupe Maple, MD  ketoconazole (NIZORAL) 2 % shampoo Apply 1 application topically 2 (two) times a week. 11/08/15  Yes Guadalupe Maple, MD  metoprolol tartrate (LOPRESSOR) 25 MG tablet Take 0.5 tablets (12.5 mg total) by mouth 2 (two) times daily. 11/06/15  Yes Guadalupe Maple, MD  naproxen sodium (ANAPROX) 220 MG tablet Take 220 mg by mouth as needed.   Yes Historical Provider, MD  polyethylene glycol powder (GLYCOLAX/MIRALAX) powder Take 17 g by mouth daily.   Yes Historical Provider, MD  simvastatin (ZOCOR) 40 MG tablet TAKE 1 TABLET ONCE EACH DAY ORAL 11/06/15  Yes Guadalupe Maple, MD  benzonatate (TESSALON) 100 MG capsule Take 2 capsules (200 mg total) by mouth 3 (three) times daily as needed. 01/16/16   Volney American, PA-C  chlorpheniramine-HYDROcodone [facility] ER) 10-8 MG/5ML SUER Take 5 mLs by mouth every 12 (twelve) hours as needed for cough. 01/16/16   Volney American, PA-C      VITAL SIGNS:  Blood pressure 117/77, pulse 84, temperature 98 F (36.7 C), temperature source Oral, resp. rate 18, height 5\' 8"  (1.727 m), weight 85.7 kg (189 lb), SpO2 93 %.  PHYSICAL EXAMINATION:  Physical Exam  GENERAL:  61 y.o.-year-old patient lying in the bed in no acute distress.  EYES: Pupils equal, round, reactive to light and accommodation. No scleral icterus. Extraocular muscles intact.   HEENT: Head atraumatic, normocephalic. Oropharynx and nasopharynx clear. No oropharyngeal erythema, moist oral mucosa  NECK:  Supple, no jugular venous distention. No thyroid enlargement, no tenderness.  LUNGS: Normal breath sounds bilaterally, no wheezing, rales, rhonchi. No use of accessory muscles of respiration.  CARDIOVASCULAR: S1, S2 RRR. No murmurs, rubs, gallops, clicks.  ABDOMEN: Soft, nontender, nondistended. Bowel sounds present. No organomegaly or mass.  EXTREMITIES: No pedal edema, cyanosis, or clubbing. + 2 pedal & radial pulses b/l.   NEUROLOGIC: Cranial nerves II through XII are intact. No focal Motor or sensory deficits appreciated b/l PSYCHIATRIC: The patient is alert and oriented x 3. Good affect.  SKIN: No obvious rash, lesion, or ulcer.   LABORATORY PANEL:   CBC  Recent Labs Lab 02/14/16 0736  WBC 7.8  HGB 16.0  HCT 46.9  PLT 112*   ------------------------------------------------------------------------------------------------------------------  Chemistries   Recent Labs Lab 02/14/16 0736  NA 137  K 3.9  CL 105  CO2 25  GLUCOSE 125*  BUN 14  CREATININE 0.72  CALCIUM 9.3  AST 24  ALT 22  ALKPHOS 93  BILITOT 1.0   ------------------------------------------------------------------------------------------------------------------  Cardiac Enzymes  Recent Labs Lab 02/14/16 0736  TROPONINI <0.03   ------------------------------------------------------------------------------------------------------------------  RADIOLOGY:  Dg Chest Port 1 View  Result Date: 02/14/2016 CLINICAL DATA:  Acute onset of mid chest pain radiating to the arms similar to that with previous myocardial infarctions. No nausea vomiting or diaphoresis. Intermittent shortness of breath. EXAM: PORTABLE CHEST 1 VIEW COMPARISON:  PA and lateral chest x-ray of November 08, 2015 FINDINGS: The lungs are adequately inflated and clear. The heart and pulmonary vascularity are normal.  The mediastinum is normal in width. There is faint calcification in the wall of the aortic arch. There is no pleural effusion. The bony thorax is unremarkable. IMPRESSION: There is no acute cardiopulmonary abnormality. Electronically Signed   By: David  Martinique M.D.   On: 02/14/2016 07:48     IMPRESSION AND PLAN:   61 year old male with past medical history of hypertension, hyperlipidemia, history of coronary artery disease status post stent placement presented to the hospital with chest pain.   1. Chest pain-patient does have significant risk factors for coronary disease given his previous history of cardiac stent placement. He says his pain is very typical for his previous MIs and 2005 and 2009. -Currently his cardiac markers are negative, his EKG shows no evidence of acute ST or T-wave changes. -We'll observe on telemetry, cycle his cardiac markers. Get a cardiology consult. -I will order a nuclear medicine stress test for tomorrow morning. -Continue aspirin, Plavix, beta blocker, statin, nitroglycerin, oxygen.  2. Essential hypertension-continue metoprolol, hydrochlorothiazide, enalapril.  3. Hyperlipidemia-continue simvastatin.  4. BPH-continue finasteride.    All the records are reviewed and case discussed with ED provider. Management plans discussed with the patient, family and they are in agreement.  CODE STATUS: Full  TOTAL TIME TAKING CARE OF THIS PATIENT: 45 minutes.    Henreitta Leber M.D on 02/14/2016 at 9:49 AM  Between 7am to 6pm - Pager - [phone]  After 6pm go to [email or web address] - password [email or web address]  Office  [phone]  CC: Primary care physician; Golden Pop, MD

## 2016-02-14 NOTE — ED Notes (Signed)
Pt okayed to eat by dr Jimmye Norman, given sandwich tray

## 2016-02-14 NOTE — Progress Notes (Signed)
Nutrition Brief Note  Patient identified on the Malnutrition Screening Tool (MST) Report  Wt Readings from Last 15 Encounters:  02/14/16 189 lb (85.7 kg)  01/16/16 192 lb (87.1 kg)  11/08/15 196 lb (88.9 kg)  11/06/15 194 lb (88 kg)  03/14/15 189 lb (85.7 kg)  09/12/14 190 lb (86.2 kg)  04/19/14 192 lb (87.1 kg)    Body mass index is 28.74 kg/m. Patient meets criteria for overweight based on current BMI.   Patient denies weight loss, poor appetite, issues chewing/swallowing/choking, or nausea/vomiting/diarrhea/constipation  Current diet order is heart healthy, patient is consuming approximately 100% of meals at this time. Labs and medications reviewed.   No nutrition interventions warranted at this time. If nutrition issues arise, please consult RD.   Robert Zuniga. Robert Bernabe, MS, RD LDN Inpatient Clinical Dietitian Pager [phone]

## 2016-02-14 NOTE — ED Triage Notes (Signed)
Pt c/o acute onset central chest pain with pain radiating from right arm to left. This is how pain was with previous MI (as ad 3). Denies nausea, vomiting or sweating but has had intermittent SHOB

## 2016-02-14 NOTE — ED Provider Notes (Signed)
[facility] Emergency Department Provider Note        Time seen: ----------------------------------------- 7:22 AM on 02/14/2016 -----------------------------------------    I have reviewed the triage vital signs and the nursing notes.   HISTORY  Chief Complaint No chief complaint on file.    HPI Robert Zuniga is a 61 y.o. male presents to the ER for acute onset chest pain this morning. Patient states he had pain and pressure in his chest, pain went up his right arm across his chest down to his left arm. He had sweats and nausea with it as well as shortness of breath. Patient reports a history of same with MI in the past. He has not had to take nitroglycerin since 2012 which was his last heart attack. Patient states he had nitroglycerin twice prior to arrival with aspirin and his pain is markedly better. He denies other complaints at this time.   Past Medical History:  Diagnosis Date  . Bladder hemorrhage 2015  . CAD (coronary artery disease)   . Hyperlipidemia   . Hypertension   . Psoriasis     Patient Active Problem List   Diagnosis Date Noted  . Acute sinusitis 11/06/2015  . BPH (benign prostatic hyperplasia) 11/06/2015  . Hyperlipidemia   . Hypertension   . CAD (coronary artery disease)   . Psoriasis     Past Surgical History:  Procedure Laterality Date  . CORONARY ANGIOPLASTY WITH STENT PLACEMENT    . CYSTOSTOMY W/ BLADDER BIOPSY    . JOINT REPLACEMENT Bilateral    hips  . PROSTATE SURGERY  2015   polyps -resolved    Allergies Morphine; Influenza vaccine recombinant; Levaquin [levofloxacin in d5w]; and Niaspan [niacin er]  Social History Social History  Substance Use Topics  . Smoking status: Former Smoker    Types: Cigarettes    Quit date: 09/12/1987  . Smokeless tobacco: Never Used  . Alcohol use No    Review of Systems Constitutional: Negative for fever. Cardiovascular: Positive for chest pain Respiratory: Positive  shortness of breath Gastrointestinal: Negative for abdominal pain, positive for nausea Genitourinary: Negative for dysuria. Musculoskeletal: Negative for back pain. Skin: Positive for sweats Neurological: Negative for headaches, focal weakness or numbness.  10-point ROS otherwise negative.  ____________________________________________   PHYSICAL EXAM:  VITAL SIGNS: ED Triage Vitals  Enc Vitals Group     BP      Pulse      Resp      Temp      Temp src      SpO2      Weight      Height      Head Circumference      Peak Flow      Pain Score      Pain Loc      Pain Edu?      Excl. in [place]?     Constitutional: Alert and oriented. Well appearing and in no distress. Eyes: Conjunctivae are normal. PERRL. Normal extraocular movements. ENT   Head: Normocephalic and atraumatic.   Nose: No congestion/rhinnorhea.   Mouth/Throat: Mucous membranes are moist.   Neck: No stridor. Cardiovascular: Normal rate, regular rhythm. No murmurs, rubs, or gallops. Respiratory: Normal respiratory effort without tachypnea nor retractions. Breath sounds are clear and equal bilaterally. No wheezes/rales/rhonchi. Gastrointestinal: Soft and nontender. Normal bowel sounds Musculoskeletal: Nontender with normal range of motion in all extremities. No lower extremity tenderness nor edema. Neurologic:  Normal speech and language. No gross focal neurologic deficits  are appreciated.  Skin:  Skin is warm, dry and intact. No rash noted. Psychiatric: Mood and affect are normal. Speech and behavior are normal.  ____________________________________________  EKG: Interpreted by me. Sinus rhythm rate 86 bpm, normal PR interval, normal QRS, normal QT, normal axis. Nonspecific T-wave changes.  ____________________________________________  ED COURSE:  Pertinent labs & imaging results that were available during my care of the patient were reviewed by me and considered in my medical decision making (see  chart for details). Patient presents to the ER with symptoms of unstable angina. We will assess with labs and imaging. Currently his pain is markedly improved.   Procedures ____________________________________________   LABS (pertinent positives/negatives)  Labs Reviewed  CBC - Abnormal; Notable for the following:       Result Value   Platelets 112 (*)    All other components within normal limits  COMPREHENSIVE METABOLIC PANEL - Abnormal; Notable for the following:    Glucose, Bld 125 (*)    All other components within normal limits  TROPONIN I    RADIOLOGY Images were viewed by me  Chest x-ray Reveals no acute abnormality ____________________________________________  FINAL ASSESSMENT AND PLAN  Chest pain, unstable angina  Plan: Patient with labs and imaging as dictated above. Patient presents with chest pain and symptomatology consistent with his prior MI and concerning today for unstable angina. Pain is currently better after aspirin and nitroglycerin. He'll receive a shot of Lovenox, will discuss with medicine for admission.   Earleen Newport, MD   Note: This note was generated in part or whole with voice recognition software. Voice recognition is usually quite accurate but there are transcription errors that can and very often do occur. I apologize for any typographical errors that were not detected and corrected.     Earleen Newport, MD 02/14/16 [phone]

## 2016-02-14 NOTE — ED Notes (Signed)
Pain free at this time still. NAD.

## 2016-02-14 NOTE — Consult Note (Signed)
[facility]  CARDIOLOGY CONSULT NOTE  Patient ID: Robert Zuniga MRN: [id number] DOB/AGE: [birth year] 61 y.o.  Admit date: 02/14/2016 Referring Physician Verdell Carmine Primary Physician [place] Primary Cardiologist Nehemiah Massed Reason for Consultation Chest pain  HPI: 61 year old male referred for chest pain. Patient has a history of coronary artery disease status post myocardial infarction with multiple stents in 2005 and 2009 with a recent cath in 2012, essential hypertension, hyperlipidemia. Patient was in his usual state of health the day of admission when he developed left and right arm pain with radiation to the central chest which was 6/10 in intensity, sharp in nature with associated nausea, diaphoresis, shortness of breath, which was similar to how he felt previously when he had an MI . Admission labs notable for negative serial troponins. EKG negative for acute ST-T wave changes. Currently, the patient denies chest pain, shortness of breath, palpitations, peripheral edema, or lightheadedness. He states he does feel weak.   Review of systems complete and found to be negative unless listed above     Past Medical History:  Diagnosis Date  . Bladder hemorrhage 2015  . CAD (coronary artery disease)   . Hyperlipidemia   . Hypertension   . MI (myocardial infarction)    X3  . Psoriasis     Past Surgical History:  Procedure Laterality Date  . CORONARY ANGIOPLASTY WITH STENT PLACEMENT    . CYSTOSTOMY W/ BLADDER BIOPSY    . JOINT REPLACEMENT Bilateral    hips  . PROSTATE SURGERY  2015   polyps -resolved    Prescriptions Prior to Admission  Medication Sig Dispense Refill Last Dose  . aspirin EC 81 MG tablet Take 81 mg by mouth daily.   02/13/2016 at am  . betamethasone dipropionate (DIPROLENE) 0.05 % cream Apply topically 2 (two) times daily. 30 g 7 Past Week at Unknown time  . betamethasone dipropionate 0.05 % lotion USE AS DIRECTED DONT USE FOR MORE THAN 2 WEEKS IN SAME AREA 60 mL 2 Past  Week at Unknown time  . calcium & magnesium carbonates (MYLANTA) [id number] MG tablet Take 1 tablet by mouth daily.   prn at prn  . clopidogrel (PLAVIX) 75 MG tablet Take 1 tablet (75 mg total) by mouth daily. 30 tablet 12 02/13/2016 at am  . enalapril (VASOTEC) 10 MG tablet Take 1 tablet (10 mg total) by mouth daily. 30 tablet 12 02/13/2016 at am  . finasteride (PROSCAR) 5 MG tablet Take 1 tablet (5 mg total) by mouth at bedtime. (Patient taking differently: Take 5 mg by mouth daily. ) 30 tablet 12 02/13/2016 at am  . hydrochlorothiazide (HYDRODIURIL) 12.5 MG tablet Take 1 tablet (12.5 mg total) by mouth daily. 30 tablet 12 02/13/2016 at am  . ketoconazole (NIZORAL) 2 % shampoo Apply 1 application topically 2 (two) times a week. 120 mL 7 Past Week at Unknown time  . metoprolol tartrate (LOPRESSOR) 25 MG tablet Take 0.5 tablets (12.5 mg total) by mouth 2 (two) times daily. 30 tablet 12 02/13/2016 at pm  . naproxen sodium (ANAPROX) 220 MG tablet Take 220 mg by mouth as needed.   prn at prn  . polyethylene glycol powder (GLYCOLAX/MIRALAX) powder Take 17 g by mouth daily.   prn at prn  . simvastatin (ZOCOR) 40 MG tablet TAKE 1 TABLET ONCE EACH DAY ORAL 30 tablet 12 02/13/2016 at am  . benzonatate (TESSALON) 100 MG capsule Take 2 capsules (200 mg total) by mouth 3 (three) times daily as needed. 45 capsule 0   .  chlorpheniramine-HYDROcodone (TUSSIONEX PENNKINETIC ER) 10-8 MG/5ML SUER Take 5 mLs by mouth every 12 (twelve) hours as needed for cough. 115 mL 0    Social History   Social History  . Marital status: Married    Spouse name: N/A  . Number of children: N/A  . Years of education: N/A   Occupational History  . Not on file.   Social History Main Topics  . Smoking status: Former Smoker    Packs/day: 1.00    Years: 2.00    Types: Cigarettes    Quit date: 09/12/1987  . Smokeless tobacco: Never Used  . Alcohol use No  . Drug use: No  . Sexual activity: Not on file   Other Topics Concern  . Not  on file   Social History Narrative  . No narrative on file    Family History  Problem Relation Age of Onset  . Diabetes Mother   . Dementia Mother   . Heart disease Father   . Cancer Sister   . Liver disease Sister   . Heart disease Maternal Grandfather   . Heart disease Paternal Grandfather       Review of systems complete and found to be negative unless listed above      PHYSICAL EXAM  General: Well developed, well nourished, in no acute distress HEENT:  Normocephalic and atramatic Neck:  No JVD.  Lungs: Clear bilaterally to auscultation and percussion. Heart: HRRR . Normal S1 and S2 without gallops or murmurs.  Abdomen: Bowel sounds are positive, abdomen soft and non-tender  Msk:  Back normal, normal gait. Normal strength and tone for age. Extremities: No clubbing, cyanosis or edema.   Neuro: Alert and oriented X 3. Psych:  Good affect, responds appropriately  Labs:   Lab Results  Component Value Date   WBC 7.8 02/14/2016   HGB 16.0 02/14/2016   HCT 46.9 02/14/2016   MCV 85.4 02/14/2016   PLT 112 (L) 02/14/2016    Recent Labs Lab 02/14/16 0736  NA 137  K 3.9  CL 105  CO2 25  BUN 14  CREATININE 0.72  CALCIUM 9.3  PROT 6.8  BILITOT 1.0  ALKPHOS 93  ALT 22  AST 24  GLUCOSE 125*   Lab Results  Component Value Date   TROPONINI <0.03 02/14/2016    Lab Results  Component Value Date   CHOL 116 11/06/2015   CHOL 159 03/14/2015   CHOL 130 09/12/2014   Lab Results  Component Value Date   HDL 39 (L) 11/06/2015   HDL 47 09/12/2014   Lab Results  Component Value Date   LDLCALC 66 11/06/2015   LDLCALC 70 09/12/2014   Lab Results  Component Value Date   TRIG 54 11/06/2015   TRIG 126 03/14/2015   TRIG 65 09/12/2014   Lab Results  Component Value Date   CHOLHDL 3.0 11/06/2015   CHOLHDL 2.8 09/12/2014   No results found for: LDLDIRECT    Radiology: Dg Chest Port 1 View  Result Date: 02/14/2016 CLINICAL DATA:  Acute onset of mid chest  pain radiating to the arms similar to that with previous myocardial infarctions. No nausea vomiting or diaphoresis. Intermittent shortness of breath. EXAM: PORTABLE CHEST 1 VIEW COMPARISON:  PA and lateral chest x-ray of November 08, 2015 FINDINGS: The lungs are adequately inflated and clear. The heart and pulmonary vascularity are normal. The mediastinum is normal in width. There is faint calcification in the wall of the aortic arch. There is no pleural effusion.  The bony thorax is unremarkable. IMPRESSION: There is no acute cardiopulmonary abnormality. Electronically Signed   By: David  Martinique M.D.   On: 02/14/2016 07:48    EKG: Normal sinus rhythm, rate 65 bpm  ASSESSMENT AND PLAN:  1. Chest pain with typical and atypical features with negative serial troponin. The patient is now chest-pain free.   Recommendations: 1. Agree with current therapy 2. Nuclear stress test tomorrow morning. 3. Further recommendations pending patient's course and results of stress test   Signed: Clabe Seal, PA-C 02/14/2016, 6:23 PM

## 2016-02-15 ENCOUNTER — Observation Stay: Payer: BLUE CROSS/BLUE SHIELD

## 2016-02-15 DIAGNOSIS — I2510 Atherosclerotic heart disease of native coronary artery without angina pectoris: Secondary | ICD-10-CM | POA: Diagnosis not present

## 2016-02-15 DIAGNOSIS — E785 Hyperlipidemia, unspecified: Secondary | ICD-10-CM | POA: Diagnosis not present

## 2016-02-15 DIAGNOSIS — R079 Chest pain, unspecified: Secondary | ICD-10-CM | POA: Diagnosis not present

## 2016-02-15 DIAGNOSIS — I10 Essential (primary) hypertension: Secondary | ICD-10-CM | POA: Diagnosis not present

## 2016-02-15 LAB — NM MYOCAR MULTI W/SPECT W/WALL MOTION / EF
CHL CUP NUCLEAR SRS: 1
CHL CUP RESTING HR STRESS: 82 {beats}/min
CSEPED: 10 min
CSEPEDS: 37 s
CSEPEW: 10.6 METS
CSEPHR: 86 %
CSEPPHR: 137 {beats}/min
LV dias vol: 72 mL (ref 62–150)
LVSYSVOL: 29 mL
MPHR: 159 {beats}/min
SDS: 2
SSS: 3
TID: 0.69

## 2016-02-15 MED ORDER — TECHNETIUM TC 99M TETROFOSMIN IV KIT
12.9800 | PACK | Freq: Once | INTRAVENOUS | Status: AC | PRN
  Administered 2016-02-15: 12.98 via INTRAVENOUS

## 2016-02-15 MED ORDER — TECHNETIUM TC 99M TETROFOSMIN IV KIT
29.3600 | PACK | Freq: Once | INTRAVENOUS | Status: AC | PRN
  Administered 2016-02-15: 29.36 via INTRAVENOUS

## 2016-02-15 NOTE — Progress Notes (Signed)
A&O. Independent. For possible stress test today.

## 2016-02-15 NOTE — Progress Notes (Signed)
[place] was admitted to the Hospital on 02/14/2016 and Discharged  02/15/2016 and should be excused from work/school   for 4  days starting 02/14/2016 , may return to work/school without any restrictions.  Call Abel Presto MD, [facility]  [phone] with questions.  Henreitta Leber M.D on 02/15/2016,at 1:13 PM

## 2016-02-15 NOTE — Progress Notes (Signed)
Patient discharged via wheelchair and private vehicle. IV removed and catheter intact. All discharge instructions given and patient verbalizes understanding. Tele removed and returned. No prescriptions given to patient No distress noted.   

## 2016-02-15 NOTE — Discharge Instructions (Signed)

## 2016-02-15 NOTE — Discharge Summary (Signed)
[facility] [place] NAME: Robert Zuniga    MR#:  [id number]  DATE OF BIRTH:  1956/01/01  DATE OF ADMISSION:  02/14/2016 ADMITTING PHYSICIAN: Henreitta Leber, MD  DATE OF DISCHARGE: 02/15/2016  2:21 PM  PRIMARY CARE PHYSICIAN: Golden Pop, MD    ADMISSION DIAGNOSIS:  Unstable angina ([place]) [I20.0]  DISCHARGE DIAGNOSIS:  Active Problems:   Chest pain   SECONDARY DIAGNOSIS:   Past Medical History:  Diagnosis Date  . Bladder hemorrhage 2015  . CAD (coronary artery disease)   . Hyperlipidemia   . Hypertension   . MI (myocardial infarction)    X3  . Psoriasis     HOSPITAL COURSE:   61 year old male with past medical history of hypertension, hyperlipidemia, history of coronary artery disease status post stent placement presented to the hospital with chest pain.  1. Chest pain-patient did have significant risk factors for coronary disease given his previous history of cardiac stent placement.  -Patient was observed overnight on telemetry, serial cardiac markers were obtained which remained negative. He was seen by cardiology and underwent a nuclear medicine stress test which showed no evidence of ischemia. He had normal ejection fraction. -He is currently chest pain-free and hemodynamically stable. He will continue and be discharged on his aspirin and Plavix beta blocker and statin mentioned below.  2. Essential hypertension- he will continue metoprolol, hydrochlorothiazide, enalapril.  3. Hyperlipidemia- he will continue simvastatin.  4. BPH- he will continue finasteride.   DISCHARGE CONDITIONS:   Stable.   CONSULTS OBTAINED:  Treatment Team:  Isaias Cowman, MD  DRUG ALLERGIES:   Allergies  Allergen Reactions  . Morphine Nausea Only and Nausea And Vomiting  . Influenza Vaccine Recombinant Other (See Comments)    Joint pain  . Levaquin [Levofloxacin In D5w] Nausea And Vomiting  . Niaspan [Niacin Er] Hives     DISCHARGE MEDICATIONS:   Allergies as of 02/15/2016      Reactions   Morphine Nausea Only, Nausea And Vomiting   Influenza Vaccine Recombinant Other (See Comments)   Joint pain   Levaquin [levofloxacin In D5w] Nausea And Vomiting   Niaspan [niacin Er] Hives      Medication List    TAKE these medications   aspirin EC 81 MG tablet Take 81 mg by mouth daily.   benzonatate 100 MG capsule Commonly known as:  TESSALON Take 2 capsules (200 mg total) by mouth 3 (three) times daily as needed.   betamethasone dipropionate 0.05 % lotion USE AS DIRECTED DONT USE FOR MORE THAN 2 WEEKS IN SAME AREA   betamethasone dipropionate 0.05 % cream Commonly known as:  DIPROLENE Apply topically 2 (two) times daily.   calcium & magnesium carbonates 311-232 MG tablet Commonly known as:  MYLANTA Take 1 tablet by mouth daily.   chlorpheniramine-HYDROcodone 10-8 MG/5ML Suer Commonly known as:  TUSSIONEX PENNKINETIC ER Take 5 mLs by mouth every 12 (twelve) hours as needed for cough.   clopidogrel 75 MG tablet Commonly known as:  PLAVIX Take 1 tablet (75 mg total) by mouth daily.   enalapril 10 MG tablet Commonly known as:  VASOTEC Take 1 tablet (10 mg total) by mouth daily.   finasteride 5 MG tablet Commonly known as:  PROSCAR Take 1 tablet (5 mg total) by mouth at bedtime. What changed:  when to take this   hydrochlorothiazide 12.5 MG tablet Commonly known as:  HYDRODIURIL Take 1 tablet (12.5 mg total) by mouth daily.   ketoconazole  2 % shampoo Commonly known as:  NIZORAL Apply 1 application topically 2 (two) times a week.   metoprolol tartrate 25 MG tablet Commonly known as:  LOPRESSOR Take 0.5 tablets (12.5 mg total) by mouth 2 (two) times daily.   naproxen sodium 220 MG tablet Commonly known as:  ANAPROX Take 220 mg by mouth as needed.   polyethylene glycol powder powder Commonly known as:  GLYCOLAX/MIRALAX Take 17 g by mouth daily.   simvastatin 40 MG tablet Commonly  known as:  ZOCOR TAKE 1 TABLET ONCE EACH DAY ORAL         DISCHARGE INSTRUCTIONS:   DIET:  Cardiac diet  DISCHARGE CONDITION:  Stable  ACTIVITY:  Activity as tolerated  OXYGEN:  Home Oxygen: No.   Oxygen Delivery: room air  DISCHARGE LOCATION:  home   If you experience worsening of your admission symptoms, develop shortness of breath, life threatening emergency, suicidal or homicidal thoughts you must seek medical attention immediately by calling 911 or calling your MD immediately  if symptoms less severe.  You Must read complete instructions/literature along with all the possible adverse reactions/side effects for all the Medicines you take and that have been prescribed to you. Take any new Medicines after you have completely understood and accpet all the possible adverse reactions/side effects.   Please note  You were cared for by a hospitalist during your hospital stay. If you have any questions about your discharge medications or the care you received while you were in the hospital after you are discharged, you can call the unit and asked to speak with the hospitalist on call if the hospitalist that took care of you is not available. Once you are discharged, your primary care physician will handle any further medical issues. Please note that NO REFILLS for any discharge medications will be authorized once you are discharged, as it is imperative that you return to your primary care physician (or establish a relationship with a primary care physician if you do not have one) for your aftercare needs so that they can reassess your need for medications and monitor your lab values.     Today   CP free.  Had stress test this a.m. Wife at bedside. No complaints presently.  No events overnight.   VITAL SIGNS:  Blood pressure (!) 148/82, pulse 89, temperature 99.3 F (37.4 C), temperature source Oral, resp. rate 20, height 5\' 8"  (1.727 m), weight 85.7 kg (189 lb), SpO2 97  %.  I/O:   Intake/Output Summary (Last 24 hours) at 02/15/16 1536 Last data filed at 02/15/16 0053  Gross per 24 hour  Intake              240 ml  Output              600 ml  Net             -360 ml    PHYSICAL EXAMINATION:  GENERAL:  61 y.o.-year-old patient lying in the bed with no acute distress.  EYES: Pupils equal, round, reactive to light and accommodation. No scleral icterus. Extraocular muscles intact.  HEENT: Head atraumatic, normocephalic. Oropharynx and nasopharynx clear.  NECK:  Supple, no jugular venous distention. No thyroid enlargement, no tenderness.  LUNGS: Normal breath sounds bilaterally, no wheezing, rales,rhonchi. No use of accessory muscles of respiration.  CARDIOVASCULAR: S1, S2 normal. No murmurs, rubs, or gallops.  ABDOMEN: Soft, non-tender, non-distended. Bowel sounds present. No organomegaly or mass.  EXTREMITIES: No pedal edema, cyanosis, or  clubbing.  NEUROLOGIC: Cranial nerves II through XII are intact. No focal motor or sensory defecits b/l.  PSYCHIATRIC: The patient is alert and oriented x 3. Good affect.  SKIN: No obvious rash, lesion, or ulcer.   DATA REVIEW:   CBC  Recent Labs Lab 02/14/16 0736  WBC 7.8  HGB 16.0  HCT 46.9  PLT 112*    Chemistries   Recent Labs Lab 02/14/16 0736  NA 137  K 3.9  CL 105  CO2 25  GLUCOSE 125*  BUN 14  CREATININE 0.72  CALCIUM 9.3  AST 24  ALT 22  ALKPHOS 93  BILITOT 1.0    Cardiac Enzymes  Recent Labs Lab 02/14/16 1919  TROPONINI <0.03    Microbiology Results  Results for orders placed or performed in visit on 01/16/16  Influenza A & B (STAT)     Status: None   Collection Time: 01/16/16  8:18 AM  Result Value Ref Range Status   Influenza A Negative Negative Final   Influenza B Negative Negative Final    Comment: If the test is negative for the presence of influenza A or influenza B antigen, infection due to influenza cannot be ruled-out because the antigen present in the sample  may be below the detection limit of the test. It is recommended that these results be confirmed by viral culture or an FDA-cleared influenza A and B molecular assay.     RADIOLOGY:  Nm Myocar Multi W/spect W/wall Motion / Ef  Result Date: 02/15/2016  Blood pressure demonstrated a normal response to exercise.  There was no ST segment deviation noted during stress.  The study is normal.  This is a low risk study.  The left ventricular ejection fraction is normal (55-65%).    Dg Chest Port 1 View  Result Date: 02/14/2016 CLINICAL DATA:  Acute onset of mid chest pain radiating to the arms similar to that with previous myocardial infarctions. No nausea vomiting or diaphoresis. Intermittent shortness of breath. EXAM: PORTABLE CHEST 1 VIEW COMPARISON:  PA and lateral chest x-ray of November 08, 2015 FINDINGS: The lungs are adequately inflated and clear. The heart and pulmonary vascularity are normal. The mediastinum is normal in width. There is faint calcification in the wall of the aortic arch. There is no pleural effusion. The bony thorax is unremarkable. IMPRESSION: There is no acute cardiopulmonary abnormality. Electronically Signed   By: David  Martinique M.D.   On: 02/14/2016 07:48      Management plans discussed with the patient, family and they are in agreement.  CODE STATUS:     Code Status Orders        Start     Ordered   02/14/16 1138  Full code  Continuous     02/14/16 1137    Code Status History    Date Active Date Inactive Code Status Order ID Comments User Context   This patient has a current code status but no historical code status.      TOTAL TIME TAKING CARE OF THIS PATIENT: 40 minutes.    Henreitta Leber M.D on 02/15/2016 at 3:36 PM  Between 7am to 6pm - Pager - [phone]  After 6pm go to [email or web address] - Proofreader  [facility]  Hospitalists  Office  [phone]  CC: Primary care physician; Golden Pop, MD

## 2016-02-29 DIAGNOSIS — Z955 Presence of coronary angioplasty implant and graft: Secondary | ICD-10-CM | POA: Diagnosis not present

## 2016-02-29 DIAGNOSIS — I25119 Atherosclerotic heart disease of native coronary artery with unspecified angina pectoris: Secondary | ICD-10-CM | POA: Diagnosis not present

## 2016-02-29 DIAGNOSIS — E7800 Pure hypercholesterolemia, unspecified: Secondary | ICD-10-CM | POA: Diagnosis not present

## 2016-02-29 DIAGNOSIS — I10 Essential (primary) hypertension: Secondary | ICD-10-CM | POA: Diagnosis not present

## 2016-03-03 ENCOUNTER — Encounter: Payer: Self-pay | Admitting: Family Medicine

## 2016-03-03 ENCOUNTER — Ambulatory Visit (INDEPENDENT_AMBULATORY_CARE_PROVIDER_SITE_OTHER): Payer: BLUE CROSS/BLUE SHIELD | Admitting: Family Medicine

## 2016-03-03 DIAGNOSIS — I2510 Atherosclerotic heart disease of native coronary artery without angina pectoris: Secondary | ICD-10-CM

## 2016-03-03 DIAGNOSIS — I10 Essential (primary) hypertension: Secondary | ICD-10-CM

## 2016-03-03 DIAGNOSIS — I2583 Coronary atherosclerosis due to lipid rich plaque: Secondary | ICD-10-CM | POA: Diagnosis not present

## 2016-03-03 DIAGNOSIS — E782 Mixed hyperlipidemia: Secondary | ICD-10-CM | POA: Diagnosis not present

## 2016-03-03 NOTE — Assessment & Plan Note (Signed)
The current medical regimen is effective;  continue present plan and medications.  

## 2016-03-03 NOTE — Progress Notes (Signed)
BP 124/85   Pulse 64   Ht 5\' 8"  (1.727 m)   Wt 192 lb (87.1 kg)   SpO2 95%   BMI 29.19 kg/m    Subjective:    Patient ID: Robert Zuniga, male    DOB: [birth year], 60 y.o.   MRN: [id number]  HPI: Robert Zuniga is a 61 y.o. male  Chief Complaint  Patient presents with  . Hospitalization Follow-up    61 year old male with past medical history of hypertension, hyperlipidemia, history of coronary artery disease status post stent placement presented to the hospital with chest pain.   . Other   Follow-up hospitalization reviewed notes from hospital patient with no MI cardiac status stable but due to tremendous stress was about 2 either stroke or have a heart attack. Patient is learning to let go of stress. In doing significantly better. Has been taking metoprolol very low dose 12.5 twice a day and having some bradycardia just took off 48 hour Holter monitor and has follow-up appointment with cardiology in a few weeks to review Holter monitor report. No other medications were changed.  Relevant past medical, surgical, family and social history reviewed and updated as indicated. Interim medical history since our last visit reviewed. Allergies and medications reviewed and updated.  Review of Systems  Constitutional: Negative.   Respiratory: Negative.   Cardiovascular: Negative.     Per HPI unless specifically indicated above     Objective:    BP 124/85   Pulse 64   Ht 5\' 8"  (1.727 m)   Wt 192 lb (87.1 kg)   SpO2 95%   BMI 29.19 kg/m   Wt Readings from Last 3 Encounters:  03/03/16 192 lb (87.1 kg)  02/14/16 189 lb (85.7 kg)  01/16/16 192 lb (87.1 kg)    Physical Exam  Constitutional: He is oriented to person, place, and time. He appears well-developed and well-nourished. No distress.  HENT:  Head: Normocephalic and atraumatic.  Right Ear: Hearing normal.  Left Ear: Hearing normal.  Nose: Nose normal.  Eyes: Conjunctivae and lids are normal. Right eye exhibits no  discharge. Left eye exhibits no discharge. No scleral icterus.  Cardiovascular: Normal rate, regular rhythm and normal heart sounds.   Pulmonary/Chest: Effort normal and breath sounds normal. No respiratory distress.  Musculoskeletal: Normal range of motion.  Neurological: He is alert and oriented to person, place, and time.  Skin: Skin is intact. No rash noted.  Psychiatric: He has a normal mood and affect. His speech is normal and behavior is normal. Judgment and thought content normal. Cognition and memory are normal.    Results for orders placed or performed during the hospital encounter of 02/14/16  NM Myocar Multi W/Spect W/Wall Motion / EF  Result Value Ref Range   Rest HR 82 bpm   Rest BP 120/74 mmHg   Exercise duration (sec) 37 sec   Percent HR 86 %   Exercise duration (min) 10 min   Estimated workload 10.6 METS   Peak HR 137 bpm   Peak BP 192/92 mmHg   MPHR 159 bpm   SSS 3    SRS 1    SDS 2    TID 0.69    LV sys vol 29 mL   LV dias vol 72 62 - 150 mL  CBC  Result Value Ref Range   WBC 7.8 3.8 - 10.6 K/uL   RBC 5.49 4.40 - 5.90 MIL/uL   Hemoglobin 16.0 13.0 - 18.0 g/dL   HCT 46.9  40.0 - 52.0 %   MCV 85.4 80.0 - 100.0 fL   MCH 29.2 26.0 - 34.0 pg   MCHC 34.1 32.0 - 36.0 g/dL   RDW 13.6 11.5 - 14.5 %   Platelets 112 (L) 150 - 440 K/uL  Troponin I  Result Value Ref Range   Troponin I <0.03 <0.03 ng/mL  Comprehensive metabolic panel  Result Value Ref Range   Sodium 137 135 - 145 mmol/L   Potassium 3.9 3.5 - 5.1 mmol/L   Chloride 105 101 - 111 mmol/L   CO2 25 22 - 32 mmol/L   Glucose, Bld 125 (H) 65 - 99 mg/dL   BUN 14 6 - 20 mg/dL   Creatinine, Ser 0.72 0.61 - 1.24 mg/dL   Calcium 9.3 8.9 - 10.3 mg/dL   Total Protein 6.8 6.5 - 8.1 g/dL   Albumin 4.1 3.5 - 5.0 g/dL   AST 24 15 - 41 U/L   ALT 22 17 - 63 U/L   Alkaline Phosphatase 93 38 - 126 U/L   Total Bilirubin 1.0 0.3 - 1.2 mg/dL   GFR calc non Af Amer >60 >60 mL/min   GFR calc Af Amer >60 >60 mL/min    Anion gap 7 5 - 15  Troponin I  Result Value Ref Range   Troponin I <0.03 <0.03 ng/mL  Troponin I  Result Value Ref Range   Troponin I <0.03 <0.03 ng/mL  Troponin I  Result Value Ref Range   Troponin I <0.03 <0.03 ng/mL      Assessment & Plan:   Problem List Items Addressed This Visit      Cardiovascular and Mediastinum   Hypertension    The current medical regimen is effective;  continue present plan and medications.       CAD (coronary artery disease)    The current medical regimen is effective;  continue present plan and medications.         Other   Hyperlipidemia    The current medical regimen is effective;  continue present plan and medications.           Follow up plan: Return in about 2 months (around 05/01/2016) for BMP,  Lipids, ALT, AST.

## 2016-03-12 DIAGNOSIS — I493 Ventricular premature depolarization: Secondary | ICD-10-CM | POA: Diagnosis not present

## 2016-03-13 DIAGNOSIS — I10 Essential (primary) hypertension: Secondary | ICD-10-CM | POA: Diagnosis not present

## 2016-03-13 DIAGNOSIS — Z955 Presence of coronary angioplasty implant and graft: Secondary | ICD-10-CM | POA: Diagnosis not present

## 2016-03-13 DIAGNOSIS — E784 Other hyperlipidemia: Secondary | ICD-10-CM | POA: Diagnosis not present

## 2016-03-13 DIAGNOSIS — I2510 Atherosclerotic heart disease of native coronary artery without angina pectoris: Secondary | ICD-10-CM | POA: Diagnosis not present

## 2016-05-05 ENCOUNTER — Ambulatory Visit (INDEPENDENT_AMBULATORY_CARE_PROVIDER_SITE_OTHER): Payer: BLUE CROSS/BLUE SHIELD | Admitting: Family Medicine

## 2016-05-05 ENCOUNTER — Encounter: Payer: Self-pay | Admitting: Family Medicine

## 2016-05-05 VITALS — BP 131/85 | HR 99 | Wt 190.0 lb

## 2016-05-05 DIAGNOSIS — E782 Mixed hyperlipidemia: Secondary | ICD-10-CM

## 2016-05-05 DIAGNOSIS — I10 Essential (primary) hypertension: Secondary | ICD-10-CM | POA: Diagnosis not present

## 2016-05-05 LAB — LP+ALT+AST PICCOLO, WAIVED
ALT (SGPT) Piccolo, Waived: 28 U/L (ref 10–47)
AST (SGOT) PICCOLO, WAIVED: 36 U/L (ref 11–38)
CHOL/HDL RATIO PICCOLO,WAIVE: 3 mg/dL
CHOLESTEROL PICCOLO, WAIVED: 128 mg/dL (ref <200)
HDL Chol Piccolo, Waived: 42 mg/dL — ABNORMAL LOW (ref >59)
LDL CHOL CALC PICCOLO WAIVED: 67 mg/dL (ref <100)
TRIGLYCERIDES PICCOLO,WAIVED: 96 mg/dL (ref <150)
VLDL Chol Calc Piccolo,Waive: 19 mg/dL (ref <30)

## 2016-05-05 NOTE — Assessment & Plan Note (Signed)
The current medical regimen is effective;  continue present plan and medications.  

## 2016-05-05 NOTE — Progress Notes (Signed)
BP 131/85   Pulse 99   Wt 190 lb (86.2 kg)   SpO2 (!) 55%   BMI 28.89 kg/m    Subjective:    Patient ID: Robert Zuniga, male    DOB: [birth year], 61 y.o.   MRN: [id number]  HPI: Robert Zuniga is a 61 y.o. male  Chief Complaint  Patient presents with  . Follow-up  . Hypertension  . Hyperlipidemia  Patient follow-up doing well good control of blood pressure cholesterol getting good reports from cardiology. Has sleep study coming up his had 15 beat run of atrial fibrillation during the sleep. Patient otherwise no complaints takes medications faithfully without problems  Relevant past medical, surgical, family and social history reviewed and updated as indicated. Interim medical history since our last visit reviewed. Allergies and medications reviewed and updated.  Review of Systems  Constitutional: Negative.   Respiratory: Negative.   Cardiovascular: Negative.     Per HPI unless specifically indicated above     Objective:    BP 131/85   Pulse 99   Wt 190 lb (86.2 kg)   SpO2 (!) 55%   BMI 28.89 kg/m   Wt Readings from Last 3 Encounters:  05/05/16 190 lb (86.2 kg)  03/03/16 192 lb (87.1 kg)  02/14/16 189 lb (85.7 kg)    Physical Exam  Constitutional: He is oriented to person, place, and time. He appears well-developed and well-nourished.  HENT:  Head: Normocephalic and atraumatic.  Eyes: Conjunctivae and EOM are normal.  Neck: Normal range of motion.  Cardiovascular: Normal rate, regular rhythm and normal heart sounds.   Pulmonary/Chest: Effort normal and breath sounds normal.  Musculoskeletal: Normal range of motion.  Neurological: He is alert and oriented to person, place, and time.  Skin: No erythema.  Psychiatric: He has a normal mood and affect. His behavior is normal. Judgment and thought content normal.    Results for orders placed or performed during the hospital encounter of 02/14/16  NM Myocar Multi W/Spect W/Wall Motion / EF  Result Value Ref  Range   Rest HR 82 bpm   Rest BP 120/74 mmHg   Exercise duration (sec) 37 sec   Percent HR 86 %   Exercise duration (min) 10 min   Estimated workload 10.6 METS   Peak HR 137 bpm   Peak BP 192/92 mmHg   MPHR 159 bpm   SSS 3    SRS 1    SDS 2    TID 0.69    LV sys vol 29 mL   LV dias vol 72 62 - 150 mL  CBC  Result Value Ref Range   WBC 7.8 3.8 - 10.6 K/uL   RBC 5.49 4.40 - 5.90 MIL/uL   Hemoglobin 16.0 13.0 - 18.0 g/dL   HCT 46.9 40.0 - 52.0 %   MCV 85.4 80.0 - 100.0 fL   MCH 29.2 26.0 - 34.0 pg   MCHC 34.1 32.0 - 36.0 g/dL   RDW 13.6 11.5 - 14.5 %   Platelets 112 (L) 150 - 440 K/uL  Troponin I  Result Value Ref Range   Troponin I <0.03 <0.03 ng/mL  Comprehensive metabolic panel  Result Value Ref Range   Sodium 137 135 - 145 mmol/L   Potassium 3.9 3.5 - 5.1 mmol/L   Chloride 105 101 - 111 mmol/L   CO2 25 22 - 32 mmol/L   Glucose, Bld 125 (H) 65 - 99 mg/dL   BUN 14 6 - 20 mg/dL  Creatinine, Ser 0.72 0.61 - 1.24 mg/dL   Calcium 9.3 8.9 - 10.3 mg/dL   Total Protein 6.8 6.5 - 8.1 g/dL   Albumin 4.1 3.5 - 5.0 g/dL   AST 24 15 - 41 U/L   ALT 22 17 - 63 U/L   Alkaline Phosphatase 93 38 - 126 U/L   Total Bilirubin 1.0 0.3 - 1.2 mg/dL   GFR calc non Af Amer >60 >60 mL/min   GFR calc Af Amer >60 >60 mL/min   Anion gap 7 5 - 15  Troponin I  Result Value Ref Range   Troponin I <0.03 <0.03 ng/mL  Troponin I  Result Value Ref Range   Troponin I <0.03 <0.03 ng/mL  Troponin I  Result Value Ref Range   Troponin I <0.03 <0.03 ng/mL      Assessment & Plan:   Problem List Items Addressed This Visit      Cardiovascular and Mediastinum   Hypertension - Primary    The current medical regimen is effective;  continue present plan and medications.       Relevant Orders   Basic metabolic panel   LP+ALT+AST Piccolo, Waived     Other   Hyperlipidemia    The current medical regimen is effective;  continue present plan and medications.       Relevant Orders   Basic  metabolic panel   LP+ALT+AST Piccolo, Waived       Follow up plan: Return in about 6 months (around 11/04/2016) for Physical Exam.

## 2016-05-06 ENCOUNTER — Encounter: Payer: Self-pay | Admitting: Family Medicine

## 2016-05-06 LAB — BASIC METABOLIC PANEL
BUN/Creatinine Ratio: 14 (ref 10–24)
BUN: 12 mg/dL (ref 8–27)
CALCIUM: 9.8 mg/dL (ref 8.6–10.2)
CHLORIDE: 104 mmol/L (ref 96–106)
CO2: 21 mmol/L (ref 18–29)
Creatinine, Ser: 0.85 mg/dL (ref 0.76–1.27)
GFR, EST AFRICAN AMERICAN: 108 mL/min/{1.73_m2} (ref >59)
GFR, EST NON AFRICAN AMERICAN: 93 mL/min/{1.73_m2} (ref >59)
Glucose: 81 mg/dL (ref 65–99)
Potassium: 4.7 mmol/L (ref 3.5–5.2)
SODIUM: 144 mmol/L (ref 134–144)

## 2016-06-03 ENCOUNTER — Ambulatory Visit: Payer: BLUE CROSS/BLUE SHIELD | Attending: Internal Medicine

## 2016-06-03 DIAGNOSIS — G4733 Obstructive sleep apnea (adult) (pediatric): Secondary | ICD-10-CM | POA: Insufficient documentation

## 2016-06-03 DIAGNOSIS — G4730 Sleep apnea, unspecified: Secondary | ICD-10-CM | POA: Diagnosis not present

## 2016-07-03 DIAGNOSIS — I10 Essential (primary) hypertension: Secondary | ICD-10-CM | POA: Diagnosis not present

## 2016-07-03 DIAGNOSIS — I2510 Atherosclerotic heart disease of native coronary artery without angina pectoris: Secondary | ICD-10-CM | POA: Diagnosis not present

## 2016-07-03 DIAGNOSIS — E785 Hyperlipidemia, unspecified: Secondary | ICD-10-CM | POA: Diagnosis not present

## 2016-07-07 ENCOUNTER — Encounter: Payer: Self-pay | Admitting: Physician Assistant

## 2016-10-04 ENCOUNTER — Other Ambulatory Visit: Payer: Self-pay | Admitting: Family Medicine

## 2016-10-04 DIAGNOSIS — I10 Essential (primary) hypertension: Secondary | ICD-10-CM

## 2016-11-18 DIAGNOSIS — I2510 Atherosclerotic heart disease of native coronary artery without angina pectoris: Secondary | ICD-10-CM | POA: Diagnosis not present

## 2016-11-18 DIAGNOSIS — E785 Hyperlipidemia, unspecified: Secondary | ICD-10-CM | POA: Diagnosis not present

## 2016-11-18 DIAGNOSIS — R001 Bradycardia, unspecified: Secondary | ICD-10-CM | POA: Diagnosis not present

## 2016-11-18 DIAGNOSIS — I10 Essential (primary) hypertension: Secondary | ICD-10-CM | POA: Diagnosis not present

## 2016-11-23 ENCOUNTER — Other Ambulatory Visit: Payer: Self-pay | Admitting: Family Medicine

## 2016-11-23 DIAGNOSIS — N400 Enlarged prostate without lower urinary tract symptoms: Secondary | ICD-10-CM

## 2016-12-27 ENCOUNTER — Other Ambulatory Visit: Payer: Self-pay | Admitting: Family Medicine

## 2016-12-27 DIAGNOSIS — I10 Essential (primary) hypertension: Secondary | ICD-10-CM

## 2016-12-27 DIAGNOSIS — I2583 Coronary atherosclerosis due to lipid rich plaque: Secondary | ICD-10-CM

## 2016-12-27 DIAGNOSIS — E782 Mixed hyperlipidemia: Secondary | ICD-10-CM

## 2016-12-27 DIAGNOSIS — I2510 Atherosclerotic heart disease of native coronary artery without angina pectoris: Secondary | ICD-10-CM

## 2016-12-29 ENCOUNTER — Other Ambulatory Visit: Payer: Self-pay | Admitting: Family Medicine

## 2017-01-09 ENCOUNTER — Encounter: Payer: Self-pay | Admitting: Family Medicine

## 2017-01-28 ENCOUNTER — Encounter: Payer: Self-pay | Admitting: Family Medicine

## 2017-01-28 ENCOUNTER — Other Ambulatory Visit: Payer: Self-pay

## 2017-01-28 ENCOUNTER — Ambulatory Visit: Payer: BLUE CROSS/BLUE SHIELD | Admitting: Family Medicine

## 2017-01-28 VITALS — BP 134/86 | HR 60 | Temp 98.8°F | Resp 16 | Wt 187.9 lb

## 2017-01-28 DIAGNOSIS — J111 Influenza due to unidentified influenza virus with other respiratory manifestations: Secondary | ICD-10-CM | POA: Diagnosis not present

## 2017-01-28 DIAGNOSIS — R6889 Other general symptoms and signs: Secondary | ICD-10-CM | POA: Diagnosis not present

## 2017-01-28 LAB — VERITOR FLU A/B WAIVED
Influenza A: NEGATIVE
Influenza B: NEGATIVE

## 2017-01-28 MED ORDER — ONDANSETRON 4 MG PO TBDP: 4.0000 mg | ORAL_TABLET | Freq: Three times a day (TID) | ORAL | 0 refills | Status: DC | PRN

## 2017-01-28 MED ORDER — OSELTAMIVIR PHOSPHATE 75 MG PO CAPS: 75.0000 mg | ORAL_CAPSULE | Freq: Two times a day (BID) | ORAL | 0 refills | Status: DC

## 2017-01-28 MED ORDER — HYDROCOD POLST-CPM POLST ER 10-8 MG/5ML PO SUER: 5.0000 mL | Freq: Two times a day (BID) | ORAL | 0 refills | Status: DC | PRN

## 2017-01-28 NOTE — Progress Notes (Signed)
BP 134/86 (BP Location: Left Arm, Patient Position: Sitting)   Pulse 60   Temp 98.8 F (37.1 C) (Oral)   Resp 16   Wt 187 lb 14.4 oz (85.2 kg)   BMI 28.57 kg/m    Subjective:    Patient ID: Robert Zuniga, male    DOB: [birth year], 62 y.o.   MRN: [id number]  HPI: Robert Zuniga is a 62 y.o. male  Chief Complaint  Patient presents with  . Sore Throat    sneezing, running nose.   Marland Kitchen Cough    no productive, SOB   . Nausea   Rhinorrhea, sore throat, dry cough, rib soreness, nausea, low grade fevers, generalized body aches and fatigue x 2 days. Taking mucinex with no relief. Denies Cp, SOB, vomiting. Past hx of smoking, no known pulmonary dz.   Past Medical History:  Diagnosis Date  . Bladder hemorrhage 2015  . CAD (coronary artery disease)   . Hyperlipidemia   . Hypertension   . MI (myocardial infarction) ([place])    X3  . Psoriasis      Social History   Socioeconomic History  . Marital status: Married    Spouse name: Not on file  . Number of children: Not on file  . Years of education: Not on file  . Highest education level: Not on file  Social Needs  . Financial resource strain: Not on file  . Food insecurity - worry: Not on file  . Food insecurity - inability: Not on file  . Transportation needs - medical: Not on file  . Transportation needs - non-medical: Not on file  Occupational History  . Not on file  Tobacco Use  . Smoking status: Former Smoker    Packs/day: 1.00    Years: 2.00    Pack years: 2.00    Types: Cigarettes    Last attempt to quit: 09/12/1987    Years since quitting: 29.4  . Smokeless tobacco: Never Used  Substance and Sexual Activity  . Alcohol use: No    Alcohol/week: 0.0 oz  . Drug use: No  . Sexual activity: Not on file  Other Topics Concern  . Not on file  Social History Narrative  . Not on file   Relevant past medical, surgical, family and social history reviewed and updated as indicated. Interim medical history since our last  visit reviewed. Allergies and medications reviewed and updated.  Review of Systems  Constitutional: Positive for fatigue and fever.  HENT: Positive for rhinorrhea and sore throat.   Respiratory: Positive for cough and chest tightness.   Cardiovascular: Negative.   Gastrointestinal: Positive for nausea.  Genitourinary: Negative.   Musculoskeletal: Positive for myalgias.  Neurological: Negative.   Psychiatric/Behavioral: Negative.    Per HPI unless specifically indicated above     Objective:    BP 134/86 (BP Location: Left Arm, Patient Position: Sitting)   Pulse 60   Temp 98.8 F (37.1 C) (Oral)   Resp 16   Wt 187 lb 14.4 oz (85.2 kg)   BMI 28.57 kg/m   Wt Readings from Last 3 Encounters:  01/28/17 187 lb 14.4 oz (85.2 kg)  05/05/16 190 lb (86.2 kg)  03/03/16 192 lb (87.1 kg)    Physical Exam  Constitutional: He is oriented to person, place, and time. He appears well-developed and well-nourished. No distress.  HENT:  Head: Atraumatic.  Right Ear: External ear normal.  Left Ear: External ear normal.  Mouth/Throat: No oropharyngeal exudate.  Oropharynx and nasal mucosa erythematous  and mildly edematous  Eyes: Conjunctivae are normal. No scleral icterus.  Neck: Normal range of motion. Neck supple.  Cardiovascular: Normal rate and normal heart sounds.  Pulmonary/Chest: Effort normal. No respiratory distress. He has wheezes (minimal).  Musculoskeletal: Normal range of motion.  Lymphadenopathy:    He has no cervical adenopathy.  Neurological: He is alert and oriented to person, place, and time.  Skin: Skin is warm and dry.  Psychiatric: He has a normal mood and affect. His behavior is normal.  Nursing note and vitals reviewed.  Results for orders placed or performed in visit on 01/28/17  Veritor Flu A/B Waived  Result Value Ref Range   Influenza A Negative Negative   Influenza B Negative Negative      Assessment & Plan:   Problem List Items Addressed This Visit      None    Visit Diagnoses    Influenza    -  Primary   Rapid flu negative, but sxs consistent. Will treat with tamiflu in case. Tussionex sent for comfort, and pt will start plain mucinex. Supportive care reviewed   Relevant Medications   oseltamivir (TAMIFLU) 75 MG capsule   Other Relevant Orders   Veritor Flu A/B Waived (Completed)       Follow up plan: Return if symptoms worsen or fail to improve.

## 2017-01-31 NOTE — Patient Instructions (Signed)
Follow up as needed

## 2017-03-19 DIAGNOSIS — I2510 Atherosclerotic heart disease of native coronary artery without angina pectoris: Secondary | ICD-10-CM | POA: Diagnosis not present

## 2017-03-19 DIAGNOSIS — E785 Hyperlipidemia, unspecified: Secondary | ICD-10-CM | POA: Diagnosis not present

## 2017-03-19 DIAGNOSIS — Z955 Presence of coronary angioplasty implant and graft: Secondary | ICD-10-CM | POA: Diagnosis not present

## 2017-03-19 DIAGNOSIS — I10 Essential (primary) hypertension: Secondary | ICD-10-CM | POA: Diagnosis not present

## 2017-04-02 ENCOUNTER — Encounter: Payer: BLUE CROSS/BLUE SHIELD | Admitting: Family Medicine

## 2017-04-20 ENCOUNTER — Ambulatory Visit: Payer: Self-pay | Admitting: *Deleted

## 2017-04-20 ENCOUNTER — Ambulatory Visit (INDEPENDENT_AMBULATORY_CARE_PROVIDER_SITE_OTHER): Payer: BLUE CROSS/BLUE SHIELD | Admitting: Unknown Physician Specialty

## 2017-04-20 ENCOUNTER — Encounter: Payer: Self-pay | Admitting: Unknown Physician Specialty

## 2017-04-20 VITALS — BP 146/92 | HR 67 | Temp 98.3°F | Ht 68.0 in | Wt 187.6 lb

## 2017-04-20 DIAGNOSIS — R3912 Poor urinary stream: Secondary | ICD-10-CM | POA: Diagnosis not present

## 2017-04-20 DIAGNOSIS — N3001 Acute cystitis with hematuria: Secondary | ICD-10-CM | POA: Diagnosis not present

## 2017-04-20 DIAGNOSIS — R339 Retention of urine, unspecified: Secondary | ICD-10-CM

## 2017-04-20 DIAGNOSIS — R300 Dysuria: Secondary | ICD-10-CM

## 2017-04-20 DIAGNOSIS — N410 Acute prostatitis: Secondary | ICD-10-CM | POA: Diagnosis not present

## 2017-04-20 DIAGNOSIS — R39198 Other difficulties with micturition: Secondary | ICD-10-CM | POA: Diagnosis not present

## 2017-04-20 MED ORDER — DOXYCYCLINE HYCLATE 100 MG PO TABS: 100.0000 mg | ORAL_TABLET | Freq: Two times a day (BID) | ORAL | 0 refills | Status: DC

## 2017-04-20 NOTE — Telephone Encounter (Signed)
  Patient is calling to report that he has been having urinary symptoms for a while- but he has gotten to the point that he is hurting so bad with urination- that he almost can't go. He also states he has seen blood in his urine at times as well. Appointment for evaluation today. Reason for Disposition . [1] SEVERE pain with urination  (e.g., excruciating) AND [2] not improved after 2 hours of pain medicine (e.g., acetaminophen or ibuprofen)  Answer Assessment - Initial Assessment Questions 1. SYMPTOM: "What's the main symptom you're concerned about?" (e.g., frequency, incontinence)     Burning with urination- 3-4 weeks 2. ONSET: "When did the  ________  start?"     3-4 weeks 3. PAIN: "Is there any pain?" If so, ask: "How bad is it?" (Scale: 1-10; mild, moderate, severe)     severe 4. CAUSE: "What do you think is causing the symptoms?"     Patient has history of enlarged prostate- vs UTI 5. OTHER SYMPTOMS: "Do you have any other symptoms?" (e.g., fever, flank pain, blood in urine, pain with urination)     Slight blood in urine x 3 weeks, pain with urination 6. PREGNANCY: "Is there any chance you are pregnant?" "When was your last menstrual period?"     n/a  Protocols used: URINATION PAIN - MALE-A-AH, URINARY SYMPTOMS-A-AH

## 2017-04-20 NOTE — Progress Notes (Signed)
BP (!) 146/92   Pulse 67   Temp 98.3 F (36.8 C) (Oral)   Ht 5\' 8"  (1.727 m)   Wt 187 lb 9.6 oz (85.1 kg)   SpO2 96%   BMI 28.52 kg/m    Subjective:    Patient ID: Robert Zuniga, male    DOB: 01-01-1956, 62 y.o.   MRN: [id number]  HPI: Robert Zuniga is a 62 y.o. male  Chief Complaint  Patient presents with  . Urinary Tract Infection    pt states he has had burning with urination for about 3 to 4 weeks. states he is not able to void since last night   Pt states he has burning upon urination and unable to urinate since last night. Burning is ongoing for about 3 weeks.  He has a history of surgery in 2015.  on prostate and bladder due to bladder wall thickening and 5 nodules on prostate.  No cancer was found  Pt states this is not the same symptoms.  States he has some blood in his urine.  No fever.  Pelvic pain off and on since last night.    Relevant past medical, surgical, family and social history reviewed and updated as indicated. Interim medical history since our last visit reviewed. Allergies and medications reviewed and updated.  Review of Systems  Per HPI unless specifically indicated above     Objective:    BP (!) 146/92   Pulse 67   Temp 98.3 F (36.8 C) (Oral)   Ht 5\' 8"  (1.727 m)   Wt 187 lb 9.6 oz (85.1 kg)   SpO2 96%   BMI 28.52 kg/m   Wt Readings from Last 3 Encounters:  04/20/17 187 lb 9.6 oz (85.1 kg)  01/28/17 187 lb 14.4 oz (85.2 kg)  05/05/16 190 lb (86.2 kg)    Physical Exam  Constitutional: He is oriented to person, place, and time. He appears well-developed and well-nourished. No distress.  HENT:  Head: Normocephalic and atraumatic.  Eyes: Conjunctivae and lids are normal. Right eye exhibits no discharge. Left eye exhibits no discharge. No scleral icterus.  Neck: Normal range of motion. Neck supple. No JVD present. Carotid bruit is not present.  Cardiovascular: Normal rate, regular rhythm and normal heart sounds.  Pulmonary/Chest:  Effort normal and breath sounds normal. No respiratory distress.  Abdominal: Normal appearance. He exhibits distension. He exhibits no shifting dullness, no pulsatile liver, no fluid wave, no abdominal bruit, no ascites and no mass. There is no splenomegaly or hepatomegaly. There is tenderness in the suprapubic area. There is no rebound and no guarding.  Distended bladder  Musculoskeletal: Normal range of motion.  Neurological: He is alert and oriented to person, place, and time.  Skin: Skin is warm, dry and intact. No rash noted. No pallor.  Psychiatric: He has a normal mood and affect. His behavior is normal. Judgment and thought content normal.    Results for orders placed or performed in visit on 01/28/17  Veritor Flu A/B Waived  Result Value Ref Range   Influenza A Negative Negative   Influenza B Negative Negative      Assessment & Plan:   Problem List Items Addressed This Visit    None    Visit Diagnoses    Burning with urination    -  Primary   Suspect UTI and prostatitis   Relevant Orders   UA/M w/rflx Culture, Routine   Urinary retention       Due to prostatits.  Unable to urinate since last night besides "a trickle."  Recommended ER for catheterization.     Acute prostatitis       Treat with Doxycycline BID (thinks he might be allergic to Sulfa).  Rx printed out if ER changes the RX       Follow up plan: Return if symptoms worsen or fail to improve.

## 2017-05-07 ENCOUNTER — Other Ambulatory Visit: Payer: Self-pay | Admitting: Family Medicine

## 2017-05-07 DIAGNOSIS — E782 Mixed hyperlipidemia: Secondary | ICD-10-CM

## 2017-05-07 DIAGNOSIS — I2583 Coronary atherosclerosis due to lipid rich plaque: Principal | ICD-10-CM

## 2017-05-07 DIAGNOSIS — I2510 Atherosclerotic heart disease of native coronary artery without angina pectoris: Secondary | ICD-10-CM

## 2017-05-17 ENCOUNTER — Other Ambulatory Visit: Payer: Self-pay | Admitting: Family Medicine

## 2017-05-17 DIAGNOSIS — I10 Essential (primary) hypertension: Secondary | ICD-10-CM

## 2017-06-16 ENCOUNTER — Other Ambulatory Visit: Payer: Self-pay | Admitting: Family Medicine

## 2017-06-16 DIAGNOSIS — I10 Essential (primary) hypertension: Secondary | ICD-10-CM

## 2017-06-17 ENCOUNTER — Ambulatory Visit (INDEPENDENT_AMBULATORY_CARE_PROVIDER_SITE_OTHER): Payer: BLUE CROSS/BLUE SHIELD | Admitting: Family Medicine

## 2017-06-17 ENCOUNTER — Encounter: Payer: Self-pay | Admitting: Family Medicine

## 2017-06-17 VITALS — BP 115/71 | HR 58 | Ht 68.11 in | Wt 180.0 lb

## 2017-06-17 DIAGNOSIS — I2510 Atherosclerotic heart disease of native coronary artery without angina pectoris: Secondary | ICD-10-CM | POA: Diagnosis not present

## 2017-06-17 DIAGNOSIS — R351 Nocturia: Secondary | ICD-10-CM

## 2017-06-17 DIAGNOSIS — Z1159 Encounter for screening for other viral diseases: Secondary | ICD-10-CM | POA: Diagnosis not present

## 2017-06-17 DIAGNOSIS — Z114 Encounter for screening for human immunodeficiency virus [HIV]: Secondary | ICD-10-CM

## 2017-06-17 DIAGNOSIS — E782 Mixed hyperlipidemia: Secondary | ICD-10-CM | POA: Diagnosis not present

## 2017-06-17 DIAGNOSIS — N401 Enlarged prostate with lower urinary tract symptoms: Secondary | ICD-10-CM | POA: Diagnosis not present

## 2017-06-17 DIAGNOSIS — Z1329 Encounter for screening for other suspected endocrine disorder: Secondary | ICD-10-CM

## 2017-06-17 DIAGNOSIS — J019 Acute sinusitis, unspecified: Secondary | ICD-10-CM

## 2017-06-17 DIAGNOSIS — I10 Essential (primary) hypertension: Secondary | ICD-10-CM

## 2017-06-17 DIAGNOSIS — I2583 Coronary atherosclerosis due to lipid rich plaque: Secondary | ICD-10-CM

## 2017-06-17 DIAGNOSIS — E7800 Pure hypercholesterolemia, unspecified: Secondary | ICD-10-CM | POA: Diagnosis not present

## 2017-06-17 DIAGNOSIS — Z0000 Encounter for general adult medical examination without abnormal findings: Secondary | ICD-10-CM

## 2017-06-17 DIAGNOSIS — D692 Other nonthrombocytopenic purpura: Secondary | ICD-10-CM

## 2017-06-17 DIAGNOSIS — N400 Enlarged prostate without lower urinary tract symptoms: Secondary | ICD-10-CM

## 2017-06-17 LAB — URINALYSIS, ROUTINE W REFLEX MICROSCOPIC
BILIRUBIN UA: NEGATIVE
Ketones, UA: NEGATIVE
LEUKOCYTES UA: NEGATIVE
Nitrite, UA: NEGATIVE
PROTEIN UA: NEGATIVE
Specific Gravity, UA: 1.02 (ref 1.005–1.030)
Urobilinogen, Ur: 1 mg/dL (ref 0.2–1.0)
pH, UA: 5.5 (ref 5.0–7.5)

## 2017-06-17 LAB — MICROSCOPIC EXAMINATION: Bacteria, UA: NONE SEEN

## 2017-06-17 MED ORDER — AZITHROMYCIN 250 MG PO TABS: ORAL_TABLET | ORAL | 0 refills | Status: DC

## 2017-06-17 MED ORDER — HYDROCHLOROTHIAZIDE 12.5 MG PO TABS: 12.5000 mg | ORAL_TABLET | Freq: Every day | ORAL | 12 refills | Status: DC

## 2017-06-17 MED ORDER — SIMVASTATIN 40 MG PO TABS: 40.0000 mg | ORAL_TABLET | Freq: Every day | ORAL | 12 refills | Status: DC

## 2017-06-17 MED ORDER — ENALAPRIL MALEATE 10 MG PO TABS: 10.0000 mg | ORAL_TABLET | Freq: Every day | ORAL | 12 refills | Status: DC

## 2017-06-17 MED ORDER — METOPROLOL TARTRATE 25 MG PO TABS: 25.0000 mg | ORAL_TABLET | Freq: Every day | ORAL | 12 refills | Status: DC

## 2017-06-17 MED ORDER — FINASTERIDE 5 MG PO TABS: 5.0000 mg | ORAL_TABLET | Freq: Every day | ORAL | 12 refills | Status: DC

## 2017-06-17 MED ORDER — TAMSULOSIN HCL 0.4 MG PO CAPS
0.4000 mg | ORAL_CAPSULE | Freq: Every day | ORAL | 12 refills | Status: DC
Start: 2017-06-17 — End: 2018-07-20

## 2017-06-17 MED ORDER — CLOPIDOGREL BISULFATE 75 MG PO TABS: 75.0000 mg | ORAL_TABLET | Freq: Every day | ORAL | 12 refills | Status: DC

## 2017-06-17 NOTE — Assessment & Plan Note (Signed)
Discussed sinusitis patient with recurrence of symptoms will use over-the-counter medicine such as Mucinex Tylenol Sinus and give a Z-Pak to hold and if getting worse will start.

## 2017-06-17 NOTE — Progress Notes (Signed)
BP 115/71   Pulse (!) 58   Ht 5' 8.11" (1.73 m)   Wt 180 lb (81.6 kg)   SpO2 97%   BMI 27.28 kg/m    Subjective:    Patient ID: Robert Zuniga, male    DOB: 1955/12/13, 62 y.o.   MRN: [id number]  HPI: Robert Zuniga is a 62 y.o. male  Chief Complaint  Patient presents with  . Annual Exam  . URI  Patient with last 4 days of marked nasal congestion drainage feeling bad to the point if he did not hardly have this appointment he was made an appointment. Patient also with headaches prior to developing URI symptoms and on review of typical day patient gets less than 6 hours of sleep at night. Also taking simvastatin and blood pressure medicines without problems or issues.  Prostate infection is cleared up. Discussed history of BPH with urinary retention is wondering about other agents that can help with urinary flow we will go ahead and give patient a prescription for Flomax. Relevant past medical, surgical, family and social history reviewed and updated as indicated. Interim medical history since our last visit reviewed. Allergies and medications reviewed and updated.  Review of Systems  Constitutional: Negative.   HENT: Negative.   Eyes: Negative.   Respiratory: Negative.   Cardiovascular: Negative.   Gastrointestinal: Negative.   Endocrine: Negative.   Genitourinary: Negative.   Musculoskeletal: Negative.   Skin: Negative.   Allergic/Immunologic: Negative.   Neurological: Negative.   Hematological: Negative.   Psychiatric/Behavioral: Negative.     Per HPI unless specifically indicated above     Objective:    BP 115/71   Pulse (!) 58   Ht 5' 8.11" (1.73 m)   Wt 180 lb (81.6 kg)   SpO2 97%   BMI 27.28 kg/m   Wt Readings from Last 3 Encounters:  06/17/17 180 lb (81.6 kg)  04/20/17 187 lb 9.6 oz (85.1 kg)  01/28/17 187 lb 14.4 oz (85.2 kg)    Physical Exam  Constitutional: He is oriented to person, place, and time. He appears well-developed and well-nourished.    HENT:  Head: Normocephalic and atraumatic.  Right Ear: External ear normal.  Left Ear: External ear normal.  Eyes: Pupils are equal, round, and reactive to light. Conjunctivae and EOM are normal.  Neck: Normal range of motion. Neck supple.  Cardiovascular: Normal rate, regular rhythm, normal heart sounds and intact distal pulses.  Pulmonary/Chest: Effort normal and breath sounds normal.  Abdominal: Soft. Bowel sounds are normal. There is no splenomegaly or hepatomegaly.  Genitourinary: Rectum normal, prostate normal and penis normal.  Musculoskeletal: Normal range of motion.  Neurological: He is alert and oriented to person, place, and time. He has normal reflexes.  Skin: No rash noted. No erythema.  Psychiatric: He has a normal mood and affect. His behavior is normal. Judgment and thought content normal.    Results for orders placed or performed in visit on 01/28/17  Veritor Flu A/B Waived  Result Value Ref Range   Influenza A Negative Negative   Influenza B Negative Negative      Assessment & Plan:   Problem List Items Addressed This Visit      Cardiovascular and Mediastinum   Hypertension - Primary    The current medical regimen is effective;  continue present plan and medications.       Relevant Medications   simvastatin (ZOCOR) 40 MG tablet   hydrochlorothiazide (HYDRODIURIL) 12.5 MG tablet   metoprolol tartrate (  LOPRESSOR) 25 MG tablet   enalapril (VASOTEC) 10 MG tablet   Other Relevant Orders   CBC with Differential/Platelet   Comprehensive metabolic panel   Lipid panel   Urinalysis, Routine w reflex microscopic   CAD (coronary artery disease)    The current medical regimen is effective;  continue present plan and medications.       Relevant Medications   simvastatin (ZOCOR) 40 MG tablet   hydrochlorothiazide (HYDRODIURIL) 12.5 MG tablet   metoprolol tartrate (LOPRESSOR) 25 MG tablet   enalapril (VASOTEC) 10 MG tablet   clopidogrel (PLAVIX) 75 MG tablet    Purpura senilis (HCC)    stable      Relevant Medications   simvastatin (ZOCOR) 40 MG tablet   hydrochlorothiazide (HYDRODIURIL) 12.5 MG tablet   metoprolol tartrate (LOPRESSOR) 25 MG tablet   enalapril (VASOTEC) 10 MG tablet     Respiratory   RESOLVED: Acute sinusitis    Discussed sinusitis patient with recurrence of symptoms will use over-the-counter medicine such as Mucinex Tylenol Sinus and give a Z-Pak to hold and if getting worse will start.      Relevant Medications   azithromycin (ZITHROMAX) 250 MG tablet     Genitourinary   BPH (benign prostatic hyperplasia)    Discussed BPH we will continue Proscar and add tamsulosin      Relevant Medications   finasteride (PROSCAR) 5 MG tablet   tamsulosin (FLOMAX) 0.4 MG CAPS capsule   Other Relevant Orders   CBC with Differential/Platelet   Comprehensive metabolic panel   Lipid panel   PSA   Urinalysis, Routine w reflex microscopic     Other   Hyperlipidemia    The current medical regimen is effective;  continue present plan and medications.       Relevant Medications   simvastatin (ZOCOR) 40 MG tablet   hydrochlorothiazide (HYDRODIURIL) 12.5 MG tablet   metoprolol tartrate (LOPRESSOR) 25 MG tablet   enalapril (VASOTEC) 10 MG tablet   Other Relevant Orders   CBC with Differential/Platelet   Comprehensive metabolic panel   Lipid panel   Urinalysis, Routine w reflex microscopic   CBC with Differential/Platelet   Comprehensive metabolic panel   Lipid panel   Urinalysis, Routine w reflex microscopic    Other Visit Diagnoses    Thyroid disorder screen       Relevant Orders   TSH   Need for hepatitis C screening test       Relevant Orders   Hepatitis C Antibody   Encounter for screening for HIV       Relevant Orders   HIV antibody (with reflex)      Discussed headache patient with lack of sleep headache discussed sleep habits going to bed earlier and shooting for 7 to 8 hours of sleep. Follow up  plan: Return in about 6 months (around 12/17/2017) for BMP,  Lipids, ALT, AST.

## 2017-06-17 NOTE — Telephone Encounter (Signed)
Lopressor 25 mg refill request  Has appt today (6/5 at 2:00 with Dr. Jeananne Rama

## 2017-06-17 NOTE — Assessment & Plan Note (Signed)
Discussed BPH we will continue Proscar and add tamsulosin

## 2017-06-17 NOTE — Assessment & Plan Note (Signed)
stable °

## 2017-06-17 NOTE — Assessment & Plan Note (Signed)
The current medical regimen is effective;  continue present plan and medications.  

## 2017-06-18 ENCOUNTER — Encounter: Payer: Self-pay | Admitting: Family Medicine

## 2017-06-18 LAB — COMPREHENSIVE METABOLIC PANEL
A/G RATIO: 1.9 (ref 1.2–2.2)
ALBUMIN: 4.3 g/dL (ref 3.6–4.8)
ALK PHOS: 104 IU/L (ref 39–117)
ALT: 17 IU/L (ref 0–44)
AST: 18 IU/L (ref 0–40)
BILIRUBIN TOTAL: 1 mg/dL (ref 0.0–1.2)
BUN / CREAT RATIO: 18 (ref 10–24)
BUN: 14 mg/dL (ref 8–27)
CO2: 23 mmol/L (ref 20–29)
Calcium: 9.7 mg/dL (ref 8.6–10.2)
Chloride: 103 mmol/L (ref 96–106)
Creatinine, Ser: 0.78 mg/dL (ref 0.76–1.27)
GFR calc Af Amer: 111 mL/min/{1.73_m2} (ref >59)
GFR calc non Af Amer: 96 mL/min/{1.73_m2} (ref >59)
GLOBULIN, TOTAL: 2.3 g/dL (ref 1.5–4.5)
Glucose: 104 mg/dL — ABNORMAL HIGH (ref 65–99)
POTASSIUM: 3.6 mmol/L (ref 3.5–5.2)
SODIUM: 141 mmol/L (ref 134–144)
Total Protein: 6.6 g/dL (ref 6.0–8.5)

## 2017-06-18 LAB — LIPID PANEL
CHOL/HDL RATIO: 2.6 ratio (ref 0.0–5.0)
Cholesterol, Total: 126 mg/dL (ref 100–199)
HDL: 49 mg/dL (ref >39)
LDL CALC: 61 mg/dL (ref 0–99)
TRIGLYCERIDES: 78 mg/dL (ref 0–149)
VLDL Cholesterol Cal: 16 mg/dL (ref 5–40)

## 2017-06-18 LAB — PSA: Prostate Specific Ag, Serum: 0.9 ng/mL (ref 0.0–4.0)

## 2017-06-18 LAB — CBC WITH DIFFERENTIAL/PLATELET
Basophils Absolute: 0 10*3/uL (ref 0.0–0.2)
Basos: 0 %
EOS (ABSOLUTE): 0.2 10*3/uL (ref 0.0–0.4)
EOS: 2 %
HEMATOCRIT: 45.1 % (ref 37.5–51.0)
HEMOGLOBIN: 15.5 g/dL (ref 13.0–17.7)
Immature Grans (Abs): 0 10*3/uL (ref 0.0–0.1)
Immature Granulocytes: 0 %
LYMPHS ABS: 1.3 10*3/uL (ref 0.7–3.1)
Lymphs: 17 %
MCH: 30 pg (ref 26.6–33.0)
MCHC: 34.4 g/dL (ref 31.5–35.7)
MCV: 87 fL (ref 79–97)
MONOCYTES: 11 %
MONOS ABS: 0.9 10*3/uL (ref 0.1–0.9)
NEUTROS ABS: 5.7 10*3/uL (ref 1.4–7.0)
Neutrophils: 70 %
Platelets: 189 10*3/uL (ref 150–450)
RBC: 5.17 x10E6/uL (ref 4.14–5.80)
RDW: 13.5 % (ref 12.3–15.4)
WBC: 8.1 10*3/uL (ref 3.4–10.8)

## 2017-06-18 LAB — HEPATITIS C ANTIBODY

## 2017-06-18 LAB — HIV ANTIBODY (ROUTINE TESTING W REFLEX): HIV SCREEN 4TH GENERATION: NONREACTIVE

## 2017-06-18 LAB — TSH: TSH: 1.29 u[IU]/mL (ref 0.450–4.500)

## 2017-06-22 ENCOUNTER — Telehealth: Payer: Self-pay | Admitting: Family Medicine

## 2017-06-22 NOTE — Telephone Encounter (Signed)
Pt would also like to speak with Dr Jeananne Rama about flomax prescription.

## 2017-06-22 NOTE — Telephone Encounter (Signed)
Call pt 

## 2017-06-22 NOTE — Telephone Encounter (Signed)
Pt came in and stated that he would like to have his note extended through today. Is this something that can be done for the pt? Please advise.

## 2017-06-24 NOTE — Telephone Encounter (Signed)
Called to speak to the patient. Patient's wife answered, and I spoke with her (on [place]). She states that the patient is at work but he was wanting to have a prescription for another z-pack and cough syrup because he was not getting better. She states that he may not need it now, she is not sure. She also states that the patient needs a note to be out of work Sunday and Monday, 06/21/17 and 06/22/17. She states that she is going to tell the patient to come by here when he gets off of work around 3:30. I advised the wife that Dr. Jeananne Rama may still be seeing patients and may not be able to speak to the patient right away. She verbalized understanding.

## 2017-06-25 NOTE — Telephone Encounter (Signed)
Note on my desk

## 2017-06-25 NOTE — Telephone Encounter (Signed)
Note given to Santiago Glad at the front desk.

## 2017-06-25 NOTE — Telephone Encounter (Signed)
Dr. Jeananne Rama, what were you wanting to do about this patient's letter? He stopped by the office yesterday afternoon.

## 2017-06-25 NOTE — Telephone Encounter (Signed)
Called patient left VM that note is up front

## 2017-09-24 DIAGNOSIS — Z955 Presence of coronary angioplasty implant and graft: Secondary | ICD-10-CM | POA: Diagnosis not present

## 2017-09-24 DIAGNOSIS — E785 Hyperlipidemia, unspecified: Secondary | ICD-10-CM | POA: Diagnosis not present

## 2017-09-24 DIAGNOSIS — I10 Essential (primary) hypertension: Secondary | ICD-10-CM | POA: Diagnosis not present

## 2017-09-24 DIAGNOSIS — I2510 Atherosclerotic heart disease of native coronary artery without angina pectoris: Secondary | ICD-10-CM | POA: Diagnosis not present

## 2017-09-28 DIAGNOSIS — R1030 Lower abdominal pain, unspecified: Secondary | ICD-10-CM | POA: Diagnosis not present

## 2017-09-28 DIAGNOSIS — N201 Calculus of ureter: Secondary | ICD-10-CM | POA: Diagnosis not present

## 2017-09-28 DIAGNOSIS — R309 Painful micturition, unspecified: Secondary | ICD-10-CM | POA: Diagnosis not present

## 2017-09-28 DIAGNOSIS — R102 Pelvic and perineal pain: Secondary | ICD-10-CM | POA: Diagnosis not present

## 2017-09-28 DIAGNOSIS — Z96643 Presence of artificial hip joint, bilateral: Secondary | ICD-10-CM | POA: Diagnosis not present

## 2017-09-28 DIAGNOSIS — N401 Enlarged prostate with lower urinary tract symptoms: Secondary | ICD-10-CM | POA: Diagnosis not present

## 2017-09-28 DIAGNOSIS — R3129 Other microscopic hematuria: Secondary | ICD-10-CM | POA: Diagnosis not present

## 2017-09-28 DIAGNOSIS — I10 Essential (primary) hypertension: Secondary | ICD-10-CM | POA: Diagnosis not present

## 2017-09-28 DIAGNOSIS — I252 Old myocardial infarction: Secondary | ICD-10-CM | POA: Diagnosis not present

## 2017-09-28 DIAGNOSIS — R3915 Urgency of urination: Secondary | ICD-10-CM | POA: Diagnosis not present

## 2017-10-01 DIAGNOSIS — Z79899 Other long term (current) drug therapy: Secondary | ICD-10-CM | POA: Diagnosis not present

## 2017-10-01 DIAGNOSIS — Z87891 Personal history of nicotine dependence: Secondary | ICD-10-CM | POA: Diagnosis not present

## 2017-10-01 DIAGNOSIS — I252 Old myocardial infarction: Secondary | ICD-10-CM | POA: Diagnosis not present

## 2017-10-01 DIAGNOSIS — R102 Pelvic and perineal pain: Secondary | ICD-10-CM | POA: Diagnosis not present

## 2017-10-01 DIAGNOSIS — Z6828 Body mass index (BMI) 28.0-28.9, adult: Secondary | ICD-10-CM | POA: Diagnosis not present

## 2017-10-01 DIAGNOSIS — R3129 Other microscopic hematuria: Secondary | ICD-10-CM | POA: Diagnosis not present

## 2017-10-01 DIAGNOSIS — N400 Enlarged prostate without lower urinary tract symptoms: Secondary | ICD-10-CM | POA: Diagnosis not present

## 2017-10-08 DIAGNOSIS — R319 Hematuria, unspecified: Secondary | ICD-10-CM | POA: Diagnosis not present

## 2017-10-08 DIAGNOSIS — R102 Pelvic and perineal pain: Secondary | ICD-10-CM | POA: Diagnosis not present

## 2017-10-08 DIAGNOSIS — R3989 Other symptoms and signs involving the genitourinary system: Secondary | ICD-10-CM | POA: Diagnosis not present

## 2017-10-26 DIAGNOSIS — R300 Dysuria: Secondary | ICD-10-CM | POA: Diagnosis not present

## 2017-10-26 DIAGNOSIS — Z96643 Presence of artificial hip joint, bilateral: Secondary | ICD-10-CM | POA: Diagnosis not present

## 2017-10-26 DIAGNOSIS — R319 Hematuria, unspecified: Secondary | ICD-10-CM | POA: Diagnosis not present

## 2017-10-26 DIAGNOSIS — N3289 Other specified disorders of bladder: Secondary | ICD-10-CM | POA: Diagnosis not present

## 2017-10-26 DIAGNOSIS — Z7982 Long term (current) use of aspirin: Secondary | ICD-10-CM | POA: Diagnosis not present

## 2017-10-26 DIAGNOSIS — Z882 Allergy status to sulfonamides status: Secondary | ICD-10-CM | POA: Diagnosis not present

## 2017-10-26 DIAGNOSIS — N342 Other urethritis: Secondary | ICD-10-CM | POA: Diagnosis not present

## 2017-10-26 DIAGNOSIS — Z7902 Long term (current) use of antithrombotics/antiplatelets: Secondary | ICD-10-CM | POA: Diagnosis not present

## 2017-10-26 DIAGNOSIS — N401 Enlarged prostate with lower urinary tract symptoms: Secondary | ICD-10-CM | POA: Diagnosis not present

## 2017-10-26 DIAGNOSIS — R310 Gross hematuria: Secondary | ICD-10-CM | POA: Diagnosis not present

## 2017-11-05 DIAGNOSIS — R338 Other retention of urine: Secondary | ICD-10-CM | POA: Diagnosis not present

## 2017-11-05 DIAGNOSIS — Z7982 Long term (current) use of aspirin: Secondary | ICD-10-CM | POA: Diagnosis not present

## 2017-11-05 DIAGNOSIS — T8383XA Hemorrhage of genitourinary prosthetic devices, implants and grafts, initial encounter: Secondary | ICD-10-CM | POA: Diagnosis not present

## 2017-11-05 DIAGNOSIS — N401 Enlarged prostate with lower urinary tract symptoms: Secondary | ICD-10-CM | POA: Diagnosis not present

## 2017-11-05 DIAGNOSIS — Z955 Presence of coronary angioplasty implant and graft: Secondary | ICD-10-CM | POA: Diagnosis not present

## 2017-11-05 DIAGNOSIS — I10 Essential (primary) hypertension: Secondary | ICD-10-CM | POA: Diagnosis not present

## 2017-11-05 DIAGNOSIS — I2510 Atherosclerotic heart disease of native coronary artery without angina pectoris: Secondary | ICD-10-CM | POA: Diagnosis not present

## 2017-11-05 DIAGNOSIS — Z7902 Long term (current) use of antithrombotics/antiplatelets: Secondary | ICD-10-CM | POA: Diagnosis not present

## 2017-11-05 DIAGNOSIS — R7989 Other specified abnormal findings of blood chemistry: Secondary | ICD-10-CM | POA: Diagnosis not present

## 2017-11-05 DIAGNOSIS — Z6826 Body mass index (BMI) 26.0-26.9, adult: Secondary | ICD-10-CM | POA: Diagnosis not present

## 2017-11-05 DIAGNOSIS — N3010 Interstitial cystitis (chronic) without hematuria: Secondary | ICD-10-CM | POA: Diagnosis not present

## 2017-11-05 DIAGNOSIS — R319 Hematuria, unspecified: Secondary | ICD-10-CM | POA: Diagnosis not present

## 2017-11-05 DIAGNOSIS — N99820 Postprocedural hemorrhage and hematoma of a genitourinary system organ or structure following a genitourinary system procedure: Secondary | ICD-10-CM | POA: Diagnosis not present

## 2017-11-05 DIAGNOSIS — Z79899 Other long term (current) drug therapy: Secondary | ICD-10-CM | POA: Diagnosis not present

## 2017-11-05 DIAGNOSIS — Z4889 Encounter for other specified surgical aftercare: Secondary | ICD-10-CM | POA: Diagnosis not present

## 2017-11-05 DIAGNOSIS — R1030 Lower abdominal pain, unspecified: Secondary | ICD-10-CM | POA: Diagnosis not present

## 2017-11-05 DIAGNOSIS — Y738 Miscellaneous gastroenterology and urology devices associated with adverse incidents, not elsewhere classified: Secondary | ICD-10-CM | POA: Diagnosis not present

## 2017-11-05 DIAGNOSIS — Y838 Other surgical procedures as the cause of abnormal reaction of the patient, or of later complication, without mention of misadventure at the time of the procedure: Secondary | ICD-10-CM | POA: Diagnosis not present

## 2017-11-05 DIAGNOSIS — Z9889 Other specified postprocedural states: Secondary | ICD-10-CM | POA: Diagnosis not present

## 2017-11-05 DIAGNOSIS — R339 Retention of urine, unspecified: Secondary | ICD-10-CM | POA: Diagnosis not present

## 2017-11-05 DIAGNOSIS — Z8679 Personal history of other diseases of the circulatory system: Secondary | ICD-10-CM | POA: Diagnosis not present

## 2017-11-05 DIAGNOSIS — I252 Old myocardial infarction: Secondary | ICD-10-CM | POA: Diagnosis not present

## 2017-11-05 DIAGNOSIS — R310 Gross hematuria: Secondary | ICD-10-CM | POA: Diagnosis not present

## 2017-11-06 DIAGNOSIS — R339 Retention of urine, unspecified: Secondary | ICD-10-CM | POA: Diagnosis not present

## 2017-11-06 DIAGNOSIS — N99820 Postprocedural hemorrhage and hematoma of a genitourinary system organ or structure following a genitourinary system procedure: Secondary | ICD-10-CM | POA: Diagnosis not present

## 2017-11-06 DIAGNOSIS — Z9079 Acquired absence of other genital organ(s): Secondary | ICD-10-CM | POA: Insufficient documentation

## 2017-11-06 DIAGNOSIS — Z9889 Other specified postprocedural states: Secondary | ICD-10-CM | POA: Diagnosis not present

## 2017-11-06 DIAGNOSIS — R319 Hematuria, unspecified: Secondary | ICD-10-CM | POA: Diagnosis not present

## 2017-11-06 DIAGNOSIS — R310 Gross hematuria: Secondary | ICD-10-CM | POA: Insufficient documentation

## 2017-11-06 DIAGNOSIS — N401 Enlarged prostate with lower urinary tract symptoms: Secondary | ICD-10-CM | POA: Diagnosis not present

## 2017-11-07 DIAGNOSIS — I10 Essential (primary) hypertension: Secondary | ICD-10-CM | POA: Diagnosis not present

## 2017-11-07 DIAGNOSIS — N99820 Postprocedural hemorrhage and hematoma of a genitourinary system organ or structure following a genitourinary system procedure: Secondary | ICD-10-CM | POA: Diagnosis not present

## 2017-11-07 DIAGNOSIS — R7989 Other specified abnormal findings of blood chemistry: Secondary | ICD-10-CM | POA: Diagnosis not present

## 2017-11-07 DIAGNOSIS — R339 Retention of urine, unspecified: Secondary | ICD-10-CM | POA: Diagnosis not present

## 2017-11-07 DIAGNOSIS — R310 Gross hematuria: Secondary | ICD-10-CM | POA: Diagnosis not present

## 2017-11-08 DIAGNOSIS — R339 Retention of urine, unspecified: Secondary | ICD-10-CM | POA: Diagnosis not present

## 2017-11-08 DIAGNOSIS — R310 Gross hematuria: Secondary | ICD-10-CM | POA: Diagnosis not present

## 2017-11-08 DIAGNOSIS — I10 Essential (primary) hypertension: Secondary | ICD-10-CM | POA: Diagnosis not present

## 2017-11-08 DIAGNOSIS — Z8679 Personal history of other diseases of the circulatory system: Secondary | ICD-10-CM | POA: Diagnosis not present

## 2017-11-09 DIAGNOSIS — R339 Retention of urine, unspecified: Secondary | ICD-10-CM | POA: Diagnosis not present

## 2017-11-09 DIAGNOSIS — I10 Essential (primary) hypertension: Secondary | ICD-10-CM | POA: Diagnosis not present

## 2017-11-09 DIAGNOSIS — R310 Gross hematuria: Secondary | ICD-10-CM | POA: Diagnosis not present

## 2017-11-09 DIAGNOSIS — I2510 Atherosclerotic heart disease of native coronary artery without angina pectoris: Secondary | ICD-10-CM | POA: Diagnosis not present

## 2017-11-10 DIAGNOSIS — N99820 Postprocedural hemorrhage and hematoma of a genitourinary system organ or structure following a genitourinary system procedure: Secondary | ICD-10-CM | POA: Diagnosis not present

## 2017-11-10 DIAGNOSIS — I10 Essential (primary) hypertension: Secondary | ICD-10-CM | POA: Diagnosis not present

## 2017-11-10 DIAGNOSIS — R310 Gross hematuria: Secondary | ICD-10-CM | POA: Diagnosis not present

## 2017-11-10 DIAGNOSIS — I2510 Atherosclerotic heart disease of native coronary artery without angina pectoris: Secondary | ICD-10-CM | POA: Diagnosis not present

## 2017-11-10 DIAGNOSIS — R339 Retention of urine, unspecified: Secondary | ICD-10-CM | POA: Diagnosis not present

## 2017-11-10 MED ORDER — POLYETHYLENE GLYCOL 3350 17 G PO PACK
17.00 | PACK | ORAL | Status: DC
Start: 2017-11-11 — End: 2017-11-10

## 2017-11-10 MED ORDER — ACETAMINOPHEN 325 MG PO TABS
650.00 | ORAL_TABLET | ORAL | Status: DC
Start: ? — End: 2017-11-10

## 2017-11-10 MED ORDER — FINASTERIDE 5 MG PO TABS
5.00 | ORAL_TABLET | ORAL | Status: DC
Start: 2017-11-10 — End: 2017-11-10

## 2017-11-10 MED ORDER — TAMSULOSIN HCL 0.4 MG PO CAPS
0.40 | ORAL_CAPSULE | ORAL | Status: DC
Start: 2017-11-11 — End: 2017-11-10

## 2017-11-10 MED ORDER — PRAVASTATIN SODIUM 40 MG PO TABS
80.00 | ORAL_TABLET | ORAL | Status: DC
Start: 2017-11-11 — End: 2017-11-10

## 2017-11-10 MED ORDER — ENALAPRIL MALEATE 2.5 MG PO TABS
10.00 | ORAL_TABLET | ORAL | Status: DC
Start: 2017-11-11 — End: 2017-11-10

## 2017-11-10 MED ORDER — GENERIC EXTERNAL MEDICATION
4.00 | Status: DC
Start: ? — End: 2017-11-10

## 2017-11-23 ENCOUNTER — Encounter: Payer: Self-pay | Admitting: Family Medicine

## 2017-11-23 ENCOUNTER — Ambulatory Visit (INDEPENDENT_AMBULATORY_CARE_PROVIDER_SITE_OTHER): Payer: BLUE CROSS/BLUE SHIELD | Admitting: Family Medicine

## 2017-11-23 VITALS — BP 94/63 | HR 60 | Temp 98.4°F | Ht 68.0 in | Wt 180.2 lb

## 2017-11-23 DIAGNOSIS — R310 Gross hematuria: Secondary | ICD-10-CM | POA: Diagnosis not present

## 2017-11-23 DIAGNOSIS — I10 Essential (primary) hypertension: Secondary | ICD-10-CM | POA: Diagnosis not present

## 2017-11-23 LAB — MICROSCOPIC EXAMINATION

## 2017-11-23 LAB — CBC WITH DIFFERENTIAL/PLATELET
HEMOGLOBIN: 11.5 g/dL — AB (ref 13.0–17.7)
Hematocrit: 33.2 % — ABNORMAL LOW (ref 37.5–51.0)
LYMPHS ABS: 1 10*3/uL (ref 0.7–3.1)
Lymphs: 19 %
MCH: 29.6 pg (ref 26.6–33.0)
MCHC: 34.6 g/dL (ref 31.5–35.7)
MCV: 86 fL (ref 79–97)
MID (Absolute): 0.6 10*3/uL (ref 0.1–1.6)
MID: 12 %
NEUTROS ABS: 3.8 10*3/uL (ref 1.4–7.0)
NEUTROS PCT: 69 %
PLATELETS: 201 10*3/uL (ref 150–450)
RBC: 3.88 x10E6/uL — AB (ref 4.14–5.80)
RDW: 13 % (ref 12.3–15.4)
WBC: 5.4 10*3/uL (ref 3.4–10.8)

## 2017-11-23 LAB — UA/M W/RFLX CULTURE, ROUTINE
BILIRUBIN UA: NEGATIVE
KETONES UA: NEGATIVE
Nitrite, UA: NEGATIVE
Protein, UA: NEGATIVE
Urobilinogen, Ur: 0.2 mg/dL (ref 0.2–1.0)
pH, UA: 5.5 (ref 5.0–7.5)

## 2017-11-23 NOTE — Progress Notes (Signed)
BP 94/63 (BP Location: Left Arm, Patient Position: Sitting, Cuff Size: Normal)   Pulse 60   Temp 98.4 F (36.9 C)   Ht 5\' 8"  (1.727 m)   Wt 180 lb 4 oz (81.8 kg)   SpO2 100%   BMI 27.41 kg/m    Subjective:    Patient ID: Robert Zuniga, male    DOB: 11/02/1955, 62 y.o.   MRN: [id number]  HPI: Giles Currie is a 62 y.o. male  Chief Complaint  Patient presents with  . Hospitalization Follow-up   Here today for hospital f/u for gross hematuria with acute urinary retention and AKI 10 days post TURP procedure. Hg was found to have dropped from 13.1 - 9.1. S/p cystourethroscopy with removal of multiple clots during admission with good relief. Hematuria has resolved since 2 days post surgery. Still feeling fatigued and weak but some better than when he was admitted. Has restarted all of his medications now, including the asa and plavix.  Needs Work excuse for the 28th to the 18th.   Relevant past medical, surgical, family and social history reviewed and updated as indicated. Interim medical history since our last visit reviewed. Allergies and medications reviewed and updated.  Review of Systems  Per HPI unless specifically indicated above     Objective:    BP 94/63 (BP Location: Left Arm, Patient Position: Sitting, Cuff Size: Normal)   Pulse 60   Temp 98.4 F (36.9 C)   Ht 5\' 8"  (1.727 m)   Wt 180 lb 4 oz (81.8 kg)   SpO2 100%   BMI 27.41 kg/m   Wt Readings from Last 3 Encounters:  11/23/17 180 lb 4 oz (81.8 kg)  06/17/17 180 lb (81.6 kg)  04/20/17 187 lb 9.6 oz (85.1 kg)    Physical Exam  Constitutional: He is oriented to person, place, and time. He appears well-developed and well-nourished.  HENT:  Head: Atraumatic.  Eyes: Conjunctivae and EOM are normal.  Neck: Normal range of motion. Neck supple.  Cardiovascular: Normal rate, regular rhythm and normal heart sounds.  Pulmonary/Chest: Effort normal and breath sounds normal.  Musculoskeletal: Normal range of  motion. He exhibits no tenderness.  Neurological: He is alert and oriented to person, place, and time.  Skin: Skin is warm and dry.  Psychiatric: He has a normal mood and affect. His behavior is normal.  Nursing note and vitals reviewed.   Results for orders placed or performed in visit on 11/23/17  Urine Culture  Result Value Ref Range   Urine Culture, Routine Final report    Organism ID, Bacteria Comment   Microscopic Examination  Result Value Ref Range   WBC, UA 6-10 (A) 0 - 5 /hpf   RBC, UA 11-30 (A) 0 - 2 /hpf   Epithelial Cells (non renal) 0-10 0 - 10 /hpf   Renal Epithel, UA 0-10 (A) None seen /hpf   Bacteria, UA Few None seen/Few  CBC With Differential/Platelet  Result Value Ref Range   WBC 5.4 3.4 - 10.8 x10E3/uL   RBC 3.88 (L) 4.14 - 5.80 x10E6/uL   Hemoglobin 11.5 (L) 13.0 - 17.7 g/dL   Hematocrit 33.2 (L) 37.5 - 51.0 %   MCV 86 79 - 97 fL   MCH 29.6 26.6 - 33.0 pg   MCHC 34.6 31.5 - 35.7 g/dL   RDW 13.0 12.3 - 15.4 %   Platelets 201 150 - 450 x10E3/uL   Neutrophils 69 Not Estab. %   Lymphs 19 Not Estab. %  MID 12 Not Estab. %   Neutrophils Absolute 3.8 1.4 - 7.0 x10E3/uL   Lymphocytes Absolute 1.0 0.7 - 3.1 x10E3/uL   MID (Absolute) 0.6 0.1 - 1.6 X10E3/uL  UA/M w/rflx Culture, Routine  Result Value Ref Range   Specific Gravity, UA <1.005 (L) 1.005 - 1.030   pH, UA 5.5 5.0 - 7.5   Color, UA Straw Yellow   Appearance Ur Cloudy (A) Clear   Leukocytes, UA 2+ (A) Negative   Protein, UA Negative Negative/Trace   Glucose, UA 1+ (A) Negative   Ketones, UA Negative Negative   RBC, UA 3+ (A) Negative   Bilirubin, UA Negative Negative   Urobilinogen, Ur 0.2 0.2 - 1.0 mg/dL   Nitrite, UA Negative Negative   Microscopic Examination See below:       Assessment & Plan:   Problem List Items Addressed This Visit      Cardiovascular and Mediastinum   Hypertension    BPs soft from recent blood loss, hold HCTZ for about 2 weeks and monitor BPs closely. Push fluids.  Call with persistent abnormal readings       Other Visit Diagnoses    Gross hematuria    -  Primary   CBC stabilizing since d/c. Still having microscopic hematuria shown on U/A. Follow up with Urology on this   Relevant Orders   CBC With Differential/Platelet (Completed)   UA/M w/rflx Culture, Routine (Completed)   Urine Culture (Completed)    Recheck CBC and U/A today. Continue following with Urology. Return precautions reviewed  Follow up plan: Return in about 4 weeks (around 12/21/2017) for 6 month follow up.

## 2017-11-23 NOTE — Patient Instructions (Signed)
Hold Hydrochlorothiazide for 2 weeks, then start monitoring blood pressures as you restart it.

## 2017-11-23 NOTE — Assessment & Plan Note (Signed)
BPs soft from recent blood loss, hold HCTZ for about 2 weeks and monitor BPs closely. Push fluids. Call with persistent abnormal readings

## 2017-11-24 ENCOUNTER — Other Ambulatory Visit: Payer: Self-pay | Admitting: Family Medicine

## 2017-11-24 MED ORDER — SULFAMETHOXAZOLE-TRIMETHOPRIM 800-160 MG PO TABS: 1.0000 | ORAL_TABLET | Freq: Two times a day (BID) | ORAL | 0 refills | Status: DC

## 2017-11-25 LAB — URINE CULTURE

## 2017-12-28 ENCOUNTER — Ambulatory Visit (INDEPENDENT_AMBULATORY_CARE_PROVIDER_SITE_OTHER): Payer: BLUE CROSS/BLUE SHIELD | Admitting: Family Medicine

## 2017-12-28 ENCOUNTER — Encounter: Payer: Self-pay | Admitting: Family Medicine

## 2017-12-28 VITALS — BP 124/77 | HR 65 | Temp 98.2°F | Wt 183.0 lb

## 2017-12-28 DIAGNOSIS — I10 Essential (primary) hypertension: Secondary | ICD-10-CM

## 2017-12-28 DIAGNOSIS — Z9079 Acquired absence of other genital organ(s): Secondary | ICD-10-CM

## 2017-12-28 DIAGNOSIS — E782 Mixed hyperlipidemia: Secondary | ICD-10-CM | POA: Diagnosis not present

## 2017-12-28 LAB — LP+ALT+AST PICCOLO, WAIVED
ALT (SGPT) Piccolo, Waived: 20 U/L (ref 10–47)
AST (SGOT) Piccolo, Waived: 24 U/L (ref 11–38)
CHOL/HDL RATIO PICCOLO,WAIVE: 2.7 mg/dL
Cholesterol Piccolo, Waived: 131 mg/dL (ref <200)
HDL CHOL PICCOLO, WAIVED: 48 mg/dL — AB (ref >59)
LDL Chol Calc Piccolo Waived: 60 mg/dL (ref <100)
TRIGLYCERIDES PICCOLO,WAIVED: 113 mg/dL (ref <150)
VLDL CHOL CALC PICCOLO,WAIVE: 23 mg/dL (ref <30)

## 2017-12-28 NOTE — Assessment & Plan Note (Signed)
Surgery 9-19

## 2017-12-28 NOTE — Assessment & Plan Note (Signed)
The current medical regimen is effective;  continue present plan and medications.  

## 2017-12-28 NOTE — Progress Notes (Signed)
BP 124/77   Pulse 65   Temp 98.2 F (36.8 C) (Oral)   Wt 183 lb (83 kg)   SpO2 98%   BMI 27.83 kg/m    Subjective:    Patient ID: Robert Zuniga, male    DOB: 27-Oct-1955, 62 y.o.   MRN: [id number]  HPI: Robert Zuniga is a 62 y.o. male   Patient follow-ups had bladder procedures prostate procedures for for recurrent bleeding and urinary retention secondary to blood clots has had repeat surgery for mini TURP was done in September and has had very good results and outcome. Said no further urinary symptoms. Blood pressure doing well no complaints good blood pressure control. Cholesterol also doing well no complaints taking medications faithfully.  Relevant past medical, surgical, family and social history reviewed and updated as indicated. Interim medical history since our last visit reviewed. Allergies and medications reviewed and updated.  Review of Systems  Constitutional: Negative.   Respiratory: Negative.   Cardiovascular: Negative.     Per HPI unless specifically indicated above     Objective:    BP 124/77   Pulse 65   Temp 98.2 F (36.8 C) (Oral)   Wt 183 lb (83 kg)   SpO2 98%   BMI 27.83 kg/m   Wt Readings from Last 3 Encounters:  12/28/17 183 lb (83 kg)  11/23/17 180 lb 4 oz (81.8 kg)  06/17/17 180 lb (81.6 kg)    Physical Exam Constitutional:      Appearance: He is well-developed.  HENT:     Head: Normocephalic and atraumatic.  Eyes:     Conjunctiva/sclera: Conjunctivae normal.  Neck:     Musculoskeletal: Normal range of motion.  Cardiovascular:     Rate and Rhythm: Normal rate and regular rhythm.     Heart sounds: Normal heart sounds.  Pulmonary:     Effort: Pulmonary effort is normal.     Breath sounds: Normal breath sounds.  Musculoskeletal: Normal range of motion.  Skin:    Findings: No erythema.  Neurological:     Mental Status: He is alert and oriented to person, place, and time.  Psychiatric:        Behavior: Behavior normal.       Thought Content: Thought content normal.        Judgment: Judgment normal.     Results for orders placed or performed in visit on 11/23/17  Urine Culture  Result Value Ref Range   Urine Culture, Routine Final report    Organism ID, Bacteria Comment   Microscopic Examination  Result Value Ref Range   WBC, UA 6-10 (A) 0 - 5 /hpf   RBC, UA 11-30 (A) 0 - 2 /hpf   Epithelial Cells (non renal) 0-10 0 - 10 /hpf   Renal Epithel, UA 0-10 (A) None seen /hpf   Bacteria, UA Few None seen/Few  CBC With Differential/Platelet  Result Value Ref Range   WBC 5.4 3.4 - 10.8 x10E3/uL   RBC 3.88 (L) 4.14 - 5.80 x10E6/uL   Hemoglobin 11.5 (L) 13.0 - 17.7 g/dL   Hematocrit 33.2 (L) 37.5 - 51.0 %   MCV 86 79 - 97 fL   MCH 29.6 26.6 - 33.0 pg   MCHC 34.6 31.5 - 35.7 g/dL   RDW 13.0 12.3 - 15.4 %   Platelets 201 150 - 450 x10E3/uL   Neutrophils 69 Not Estab. %   Lymphs 19 Not Estab. %   MID 12 Not Estab. %   Neutrophils Absolute  3.8 1.4 - 7.0 x10E3/uL   Lymphocytes Absolute 1.0 0.7 - 3.1 x10E3/uL   MID (Absolute) 0.6 0.1 - 1.6 X10E3/uL  UA/M w/rflx Culture, Routine  Result Value Ref Range   Specific Gravity, UA <1.005 (L) 1.005 - 1.030   pH, UA 5.5 5.0 - 7.5   Color, UA Straw Yellow   Appearance Ur Cloudy (A) Clear   Leukocytes, UA 2+ (A) Negative   Protein, UA Negative Negative/Trace   Glucose, UA 1+ (A) Negative   Ketones, UA Negative Negative   RBC, UA 3+ (A) Negative   Bilirubin, UA Negative Negative   Urobilinogen, Ur 0.2 0.2 - 1.0 mg/dL   Nitrite, UA Negative Negative   Microscopic Examination See below:       Assessment & Plan:   Problem List Items Addressed This Visit      Cardiovascular and Mediastinum   Hypertension - Primary    The current medical regimen is effective;  continue present plan and medications.       Relevant Orders   Basic metabolic panel   LP+ALT+AST Piccolo, Waived     Other   Hyperlipidemia    The current medical regimen is effective;  continue  present plan and medications.       Relevant Orders   Basic metabolic panel   LP+ALT+AST Piccolo, Waived   S/P TURP    Surgery 9-19          Follow up plan: Return in about 6 months (around 06/29/2018) for Physical Exam.

## 2017-12-29 ENCOUNTER — Encounter: Payer: Self-pay | Admitting: Family Medicine

## 2017-12-29 LAB — BASIC METABOLIC PANEL
BUN/Creatinine Ratio: 13 (ref 10–24)
BUN: 11 mg/dL (ref 8–27)
CALCIUM: 9.2 mg/dL (ref 8.6–10.2)
CO2: 20 mmol/L (ref 20–29)
CREATININE: 0.86 mg/dL (ref 0.76–1.27)
Chloride: 105 mmol/L (ref 96–106)
GFR calc Af Amer: 107 mL/min/{1.73_m2} (ref >59)
GFR, EST NON AFRICAN AMERICAN: 92 mL/min/{1.73_m2} (ref >59)
Glucose: 116 mg/dL — ABNORMAL HIGH (ref 65–99)
POTASSIUM: 3.6 mmol/L (ref 3.5–5.2)
Sodium: 141 mmol/L (ref 134–144)

## 2018-02-18 IMAGING — DX DG CHEST 2V
2 series · 2 of 2 positions shown · non-contrast
Comparison: Chest CT and chest radiograph, 12/29/2009

CLINICAL DATA: Fever today, yellow productive cough x 2 weeks, no
shob, Tightness in chest when he coughs a lot, h/o 3 heart attacks
and 5 stents, non smoker, no asthma, no other history or surgery

EXAM:
CHEST  2 VIEW

[chest pa]
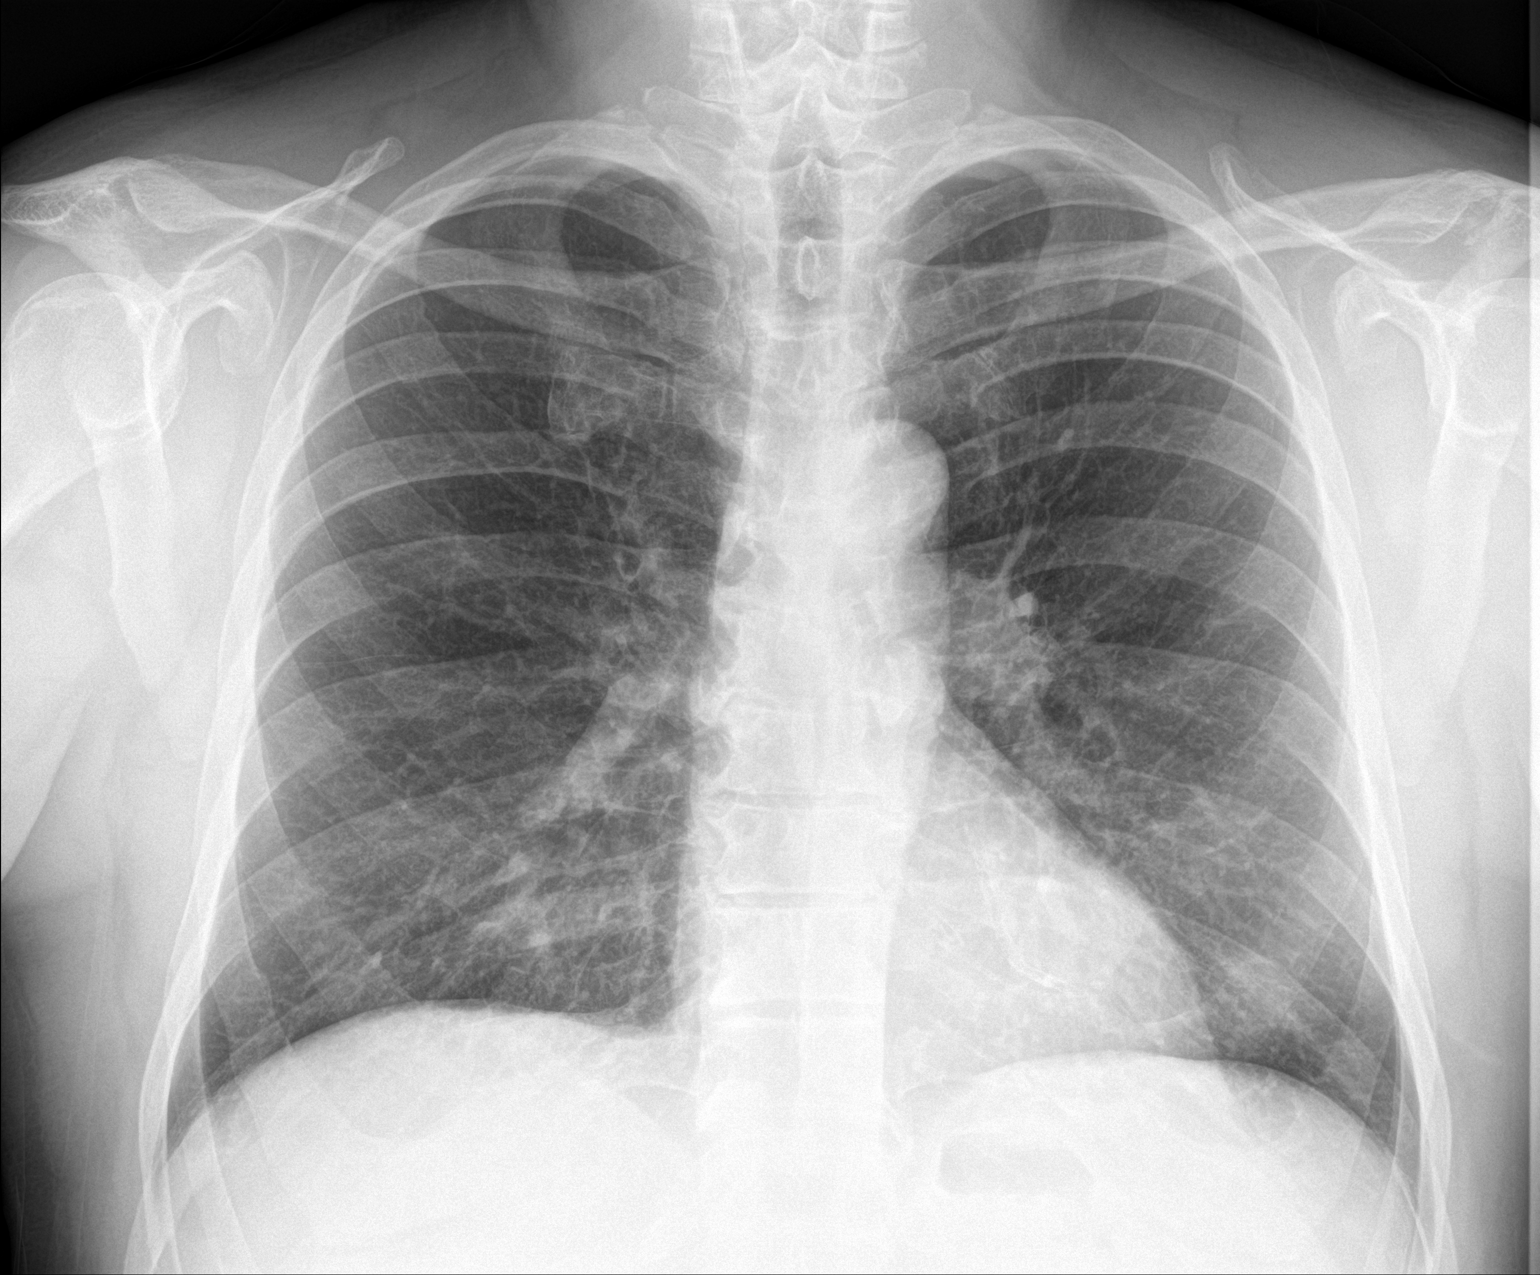

[chest lat]
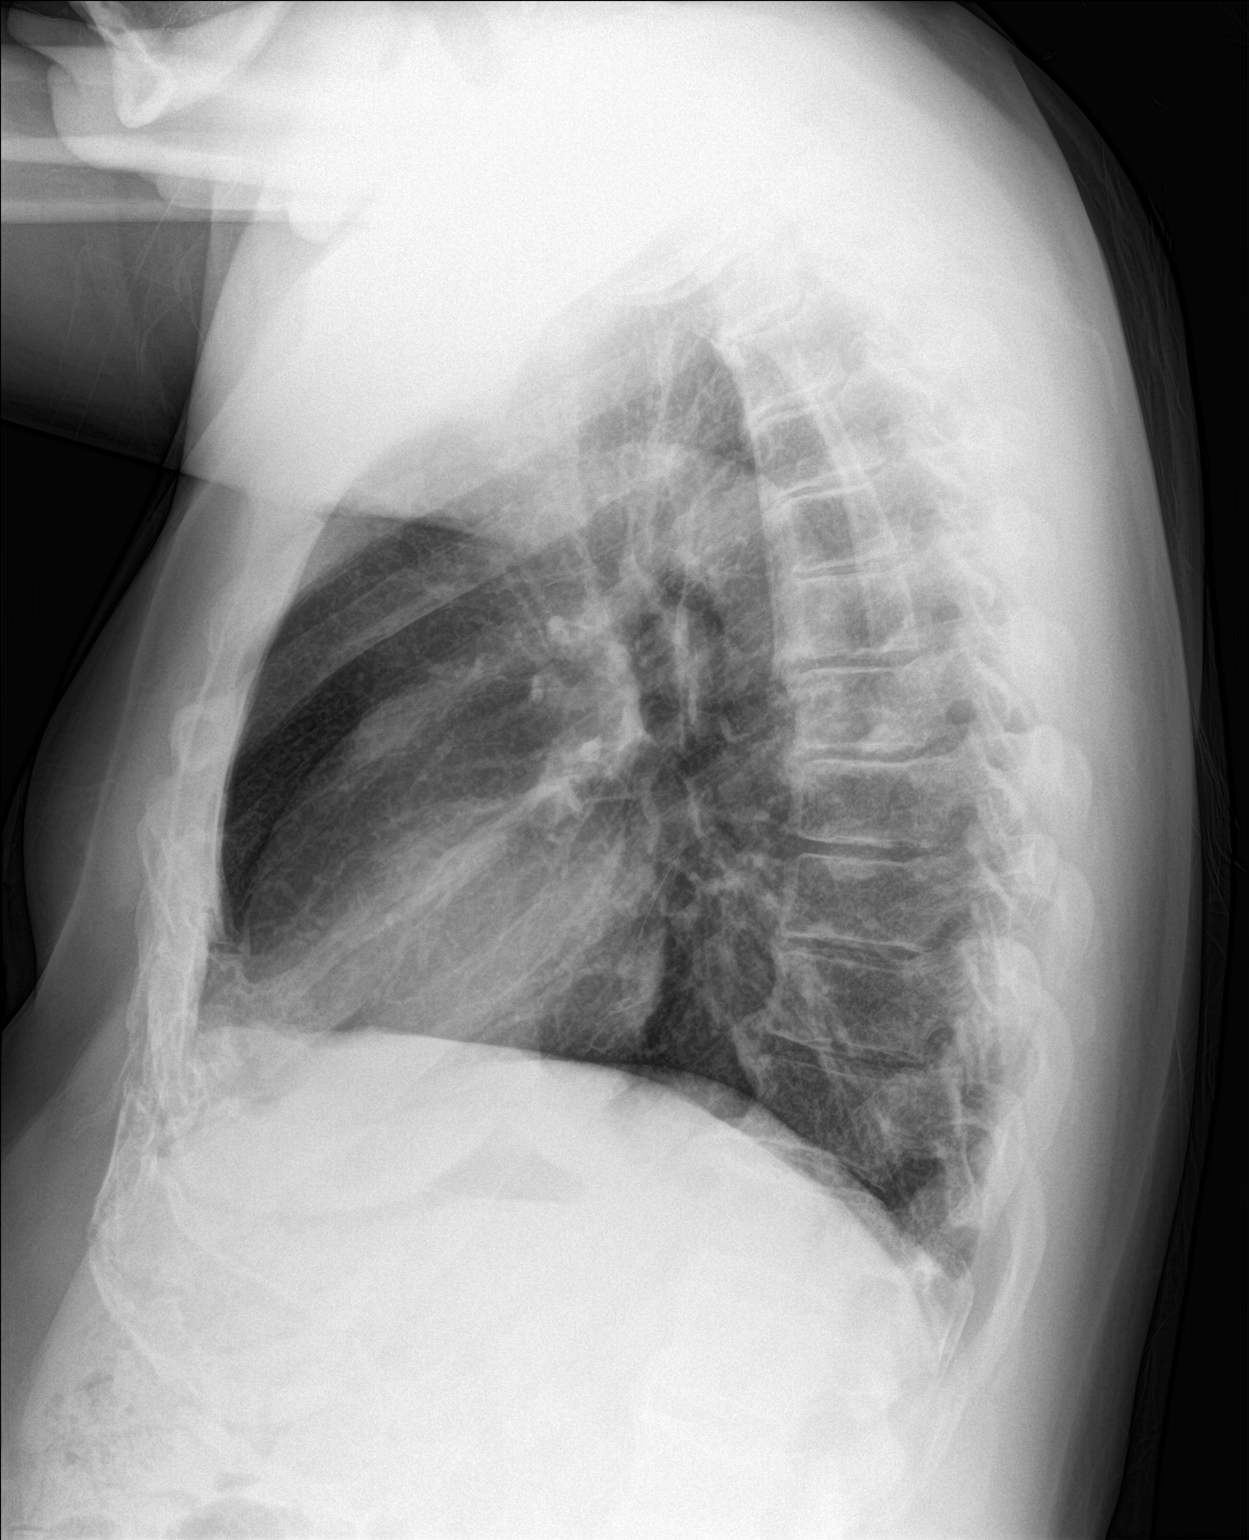

[2 of 2 positions shown; findings below may reference images not displayed]

FINDINGS: The heart size and mediastinal contours are within normal limits.
The lungs are clear. No pleural effusion or pneumothorax. The
visualized skeletal structures are intact.
IMPRESSION: No active cardiopulmonary disease.

## 2018-03-29 DIAGNOSIS — I2510 Atherosclerotic heart disease of native coronary artery without angina pectoris: Secondary | ICD-10-CM | POA: Diagnosis not present

## 2018-03-29 DIAGNOSIS — E785 Hyperlipidemia, unspecified: Secondary | ICD-10-CM | POA: Diagnosis not present

## 2018-03-29 DIAGNOSIS — I10 Essential (primary) hypertension: Secondary | ICD-10-CM | POA: Diagnosis not present

## 2018-03-29 DIAGNOSIS — I208 Other forms of angina pectoris: Secondary | ICD-10-CM | POA: Diagnosis not present

## 2018-05-26 ENCOUNTER — Telehealth: Payer: Self-pay | Admitting: Family Medicine

## 2018-05-26 NOTE — Telephone Encounter (Signed)
Pts wife stated that the pt needed a refill for his psoriasis lotion, shampoo and cream sent to [facility].

## 2018-05-27 ENCOUNTER — Other Ambulatory Visit: Payer: Self-pay | Admitting: Family Medicine

## 2018-05-27 IMAGING — DX DG CHEST 1V PORT
1 series · 1 of 1 positions shown · non-contrast
Comparison: PA and lateral chest x-ray November 08, 2015

CLINICAL DATA: Acute onset of mid chest pain radiating to the arms
similar to that with previous myocardial infarctions. No nausea
vomiting or diaphoresis. Intermittent shortness of breath.

EXAM:
PORTABLE CHEST 1 VIEW

[chest ap]
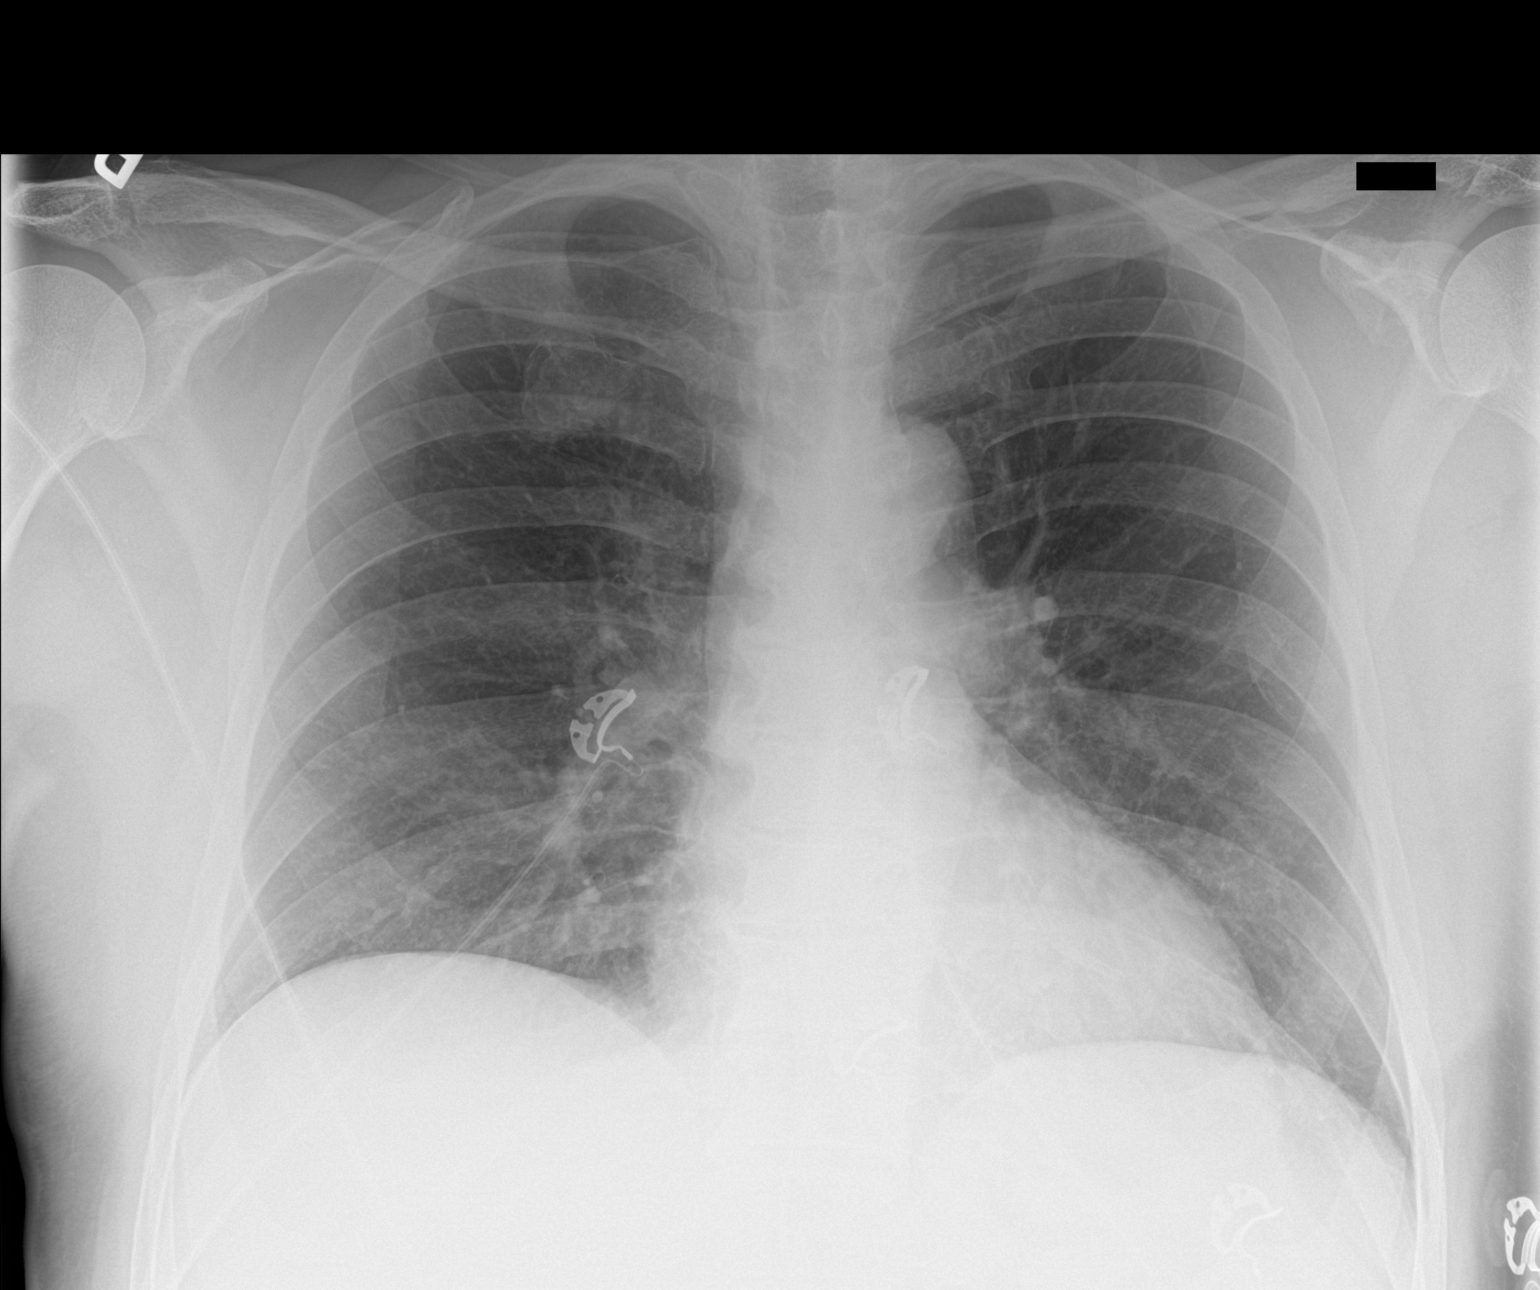

[1 of 1 positions shown; findings below may reference images not displayed]

FINDINGS: The lungs are adequately inflated and clear. The heart and pulmonary
vascularity are normal. The mediastinum is normal in width. There is
faint calcification in the wall of the aortic arch. There is no
pleural effusion. The bony thorax is unremarkable.
IMPRESSION: There is no acute cardiopulmonary abnormality.

## 2018-05-27 MED ORDER — KETOCONAZOLE 2 % EX SHAM: 1.0000 "application " | MEDICATED_SHAMPOO | CUTANEOUS | 7 refills | Status: DC

## 2018-05-27 MED ORDER — BETAMETHASONE DIPROPIONATE 0.05 % EX CREA: TOPICAL_CREAM | CUTANEOUS | 6 refills | Status: DC

## 2018-05-28 ENCOUNTER — Telehealth: Payer: Self-pay | Admitting: Family Medicine

## 2018-05-28 MED ORDER — KETOCONAZOLE 2 % EX SHAM: 1.0000 "application " | MEDICATED_SHAMPOO | CUTANEOUS | 7 refills | Status: DC

## 2018-05-28 NOTE — Telephone Encounter (Signed)
-----   Message from Stark Klein sent at 05/28/2018 10:21 AM EDT ----- Pt came in to the office and stated that he needs the brand name of the shampoo sent to the [facility]. He needs NIZORAL 2% SHAMPOO and BETAMETHASONE DIPROPIONATE LOTION,USP 0.05%(59mL)  Forwarding to Dr Wynetta Emery as the pt is out of his lotion and he is afraid it may flair with the warm weather this weekend.

## 2018-05-28 NOTE — Telephone Encounter (Signed)
Dr Jeananne Rama refilled both of those yesterday. They should be at his pharmacy.

## 2018-05-31 ENCOUNTER — Other Ambulatory Visit: Payer: Self-pay | Admitting: Family Medicine

## 2018-05-31 NOTE — Telephone Encounter (Signed)
Requested medication (s) are due for refill today -yes  Requested medication (s) are on the active medication list -yes  Future visit scheduled -yes  Last refill: 1 year  Notes to clinic: Patient had cream filled- but is requesting lotion as well. Sent for review of request.  (Call to pharmacy for clarification-in discussion- they are not able to get name brand for shampoo- patient is looking around to see if he can find source for that - if so may be simple Rx transfer- if not may need alternative treatment-FYI)  Requested Prescriptions  Pending Prescriptions Disp Refills   betamethasone dipropionate 0.05 % lotion [Pharmacy Med Name: BETAMETHASONE DP 0.05% LOT] 60 mL 0    Sig: APPLY TO AFFECTED AREA(S) AS DIRECTED. DO NOT USE FOR MORE THAN 2 WEEKS IN THE SAME AREA     Off-Protocol Failed - 05/31/2018 11:51 AM      Failed - Medication not assigned to a protocol, review manually.      Passed - Valid encounter within last 12 months    Recent Outpatient Visits          5 months ago Essential hypertension   [place] Crissman, Jeannette How, MD   6 months ago Gross hematuria   [facility] Merrie Roof [place], [place]   11 months ago Essential hypertension   [place] Crissman, Jeannette How, MD   1 year ago Burning with urination   [facility] Kathrine Haddock, NP   1 year ago Influenza   [place], Lilia Argue, [place]      Future Appointments            In 1 month Crissman, Jeannette How, MD [place], [facility]            Requested Prescriptions  Pending Prescriptions Disp Refills   betamethasone dipropionate 0.05 % lotion [Pharmacy Med Name: BETAMETHASONE DP 0.05% LOT] 60 mL 0    Sig: APPLY TO AFFECTED AREA(S) AS DIRECTED. DO NOT USE FOR MORE THAN 2 WEEKS IN THE SAME AREA     Off-Protocol Failed - 05/31/2018 11:51 AM      Failed - Medication not assigned to a protocol, review manually.      Passed -  Valid encounter within last 12 months    Recent Outpatient Visits          5 months ago Essential hypertension   [place], [place]   6 months ago Gross hematuria   [facility] Merrie Roof [place], [place]   11 months ago Essential hypertension   [place], Jeannette How, MD   1 year ago Burning with urination   [facility] Kathrine Haddock, NP   1 year ago Influenza   [place], Lilia Argue, [place]      Future Appointments            In 1 month Crissman, Jeannette How, MD [facility] [place]

## 2018-06-30 ENCOUNTER — Ambulatory Visit: Payer: Self-pay | Admitting: Family Medicine

## 2018-07-01 ENCOUNTER — Ambulatory Visit: Payer: BC Managed Care – PPO | Admitting: Family Medicine

## 2018-07-20 ENCOUNTER — Other Ambulatory Visit: Payer: Self-pay | Admitting: Family Medicine

## 2018-07-20 DIAGNOSIS — I2583 Coronary atherosclerosis due to lipid rich plaque: Secondary | ICD-10-CM

## 2018-07-20 DIAGNOSIS — E782 Mixed hyperlipidemia: Secondary | ICD-10-CM

## 2018-07-20 DIAGNOSIS — I10 Essential (primary) hypertension: Secondary | ICD-10-CM

## 2018-07-20 DIAGNOSIS — I2510 Atherosclerotic heart disease of native coronary artery without angina pectoris: Secondary | ICD-10-CM

## 2018-07-20 DIAGNOSIS — N400 Enlarged prostate without lower urinary tract symptoms: Secondary | ICD-10-CM

## 2018-07-20 NOTE — Telephone Encounter (Signed)
Spoke with wife. Patient does not have enough of his medication to last until his appointment.

## 2018-07-20 NOTE — Telephone Encounter (Signed)
Please find out if he has enough to last him to his appointment

## 2018-07-20 NOTE — Telephone Encounter (Signed)
Please advise, some were last refilled 1 year ago, pt does have a pending ov

## 2018-07-29 ENCOUNTER — Encounter: Payer: Self-pay | Admitting: Family Medicine

## 2018-07-29 ENCOUNTER — Other Ambulatory Visit: Payer: Self-pay

## 2018-07-29 ENCOUNTER — Ambulatory Visit (INDEPENDENT_AMBULATORY_CARE_PROVIDER_SITE_OTHER): Payer: BC Managed Care – PPO | Admitting: Family Medicine

## 2018-07-29 DIAGNOSIS — I10 Essential (primary) hypertension: Secondary | ICD-10-CM | POA: Diagnosis not present

## 2018-07-29 DIAGNOSIS — I2510 Atherosclerotic heart disease of native coronary artery without angina pectoris: Secondary | ICD-10-CM | POA: Diagnosis not present

## 2018-07-29 DIAGNOSIS — I2583 Coronary atherosclerosis due to lipid rich plaque: Secondary | ICD-10-CM

## 2018-07-29 DIAGNOSIS — N400 Enlarged prostate without lower urinary tract symptoms: Secondary | ICD-10-CM | POA: Diagnosis not present

## 2018-07-29 DIAGNOSIS — D692 Other nonthrombocytopenic purpura: Secondary | ICD-10-CM

## 2018-07-29 DIAGNOSIS — E782 Mixed hyperlipidemia: Secondary | ICD-10-CM | POA: Diagnosis not present

## 2018-07-29 DIAGNOSIS — L409 Psoriasis, unspecified: Secondary | ICD-10-CM

## 2018-07-29 DIAGNOSIS — Z0000 Encounter for general adult medical examination without abnormal findings: Secondary | ICD-10-CM

## 2018-07-29 DIAGNOSIS — Z9079 Acquired absence of other genital organ(s): Secondary | ICD-10-CM

## 2018-07-29 MED ORDER — BETAMETHASONE DIPROPIONATE 0.05 % EX LOTN: TOPICAL_LOTION | CUTANEOUS | 4 refills | Status: DC

## 2018-07-29 MED ORDER — CLOPIDOGREL BISULFATE 75 MG PO TABS: 75.0000 mg | ORAL_TABLET | Freq: Every day | ORAL | 12 refills | Status: DC

## 2018-07-29 MED ORDER — FINASTERIDE 5 MG PO TABS: 5.0000 mg | ORAL_TABLET | Freq: Every day | ORAL | 12 refills | Status: DC

## 2018-07-29 MED ORDER — ENALAPRIL MALEATE 10 MG PO TABS: 10.0000 mg | ORAL_TABLET | Freq: Every day | ORAL | 12 refills | Status: DC

## 2018-07-29 MED ORDER — METOPROLOL TARTRATE 25 MG PO TABS: 25.0000 mg | ORAL_TABLET | Freq: Every day | ORAL | 12 refills | Status: DC

## 2018-07-29 MED ORDER — SIMVASTATIN 40 MG PO TABS: 40.0000 mg | ORAL_TABLET | Freq: Every day | ORAL | 12 refills | Status: DC

## 2018-07-29 MED ORDER — TAMSULOSIN HCL 0.4 MG PO CAPS: 0.4000 mg | ORAL_CAPSULE | Freq: Every day | ORAL | 12 refills | Status: DC

## 2018-07-29 MED ORDER — HYDROCHLOROTHIAZIDE 12.5 MG PO TABS: 12.5000 mg | ORAL_TABLET | Freq: Every day | ORAL | 12 refills | Status: DC

## 2018-07-29 NOTE — Assessment & Plan Note (Signed)
The current medical regimen is effective;  continue present plan and medications.  

## 2018-07-29 NOTE — Progress Notes (Addendum)
There were no vitals taken for this visit.   Subjective:    Patient ID: Robert Zuniga, male    DOB: [birth year], 63 y.o.   MRN: [id number]  HPI: Robert Zuniga is a 63 y.o. male  Med check    Relevant past medical, surgical, family and social history reviewed and updated as indicated. Interim medical history since our last visit reviewed. Allergies and medications reviewed and updated.  Review of Systems  Constitutional: Negative.   HENT: Negative.   Eyes: Negative.   Respiratory: Negative.   Cardiovascular: Negative.   Gastrointestinal: Negative.   Endocrine: Negative.   Genitourinary: Negative.   Musculoskeletal: Negative.   Skin: Negative.   Allergic/Immunologic: Negative.   Neurological: Negative.   Hematological: Negative.   Psychiatric/Behavioral: Negative.     Per HPI unless specifically indicated above     Objective:    There were no vitals taken for this visit.  Wt Readings from Last 3 Encounters:  12/28/17 183 lb (83 kg)  11/23/17 180 lb 4 oz (81.8 kg)  06/17/17 180 lb (81.6 kg)    Physical Exam  Results for orders placed or performed in visit on 72/09/47  Basic metabolic panel  Result Value Ref Range   Glucose 116 (H) 65 - 99 mg/dL   BUN 11 8 - 27 mg/dL   Creatinine, Ser 0.86 0.76 - 1.27 mg/dL   GFR calc non Af Amer 92 >59 mL/min/1.73   GFR calc Af Amer 107 >59 mL/min/1.73   BUN/Creatinine Ratio 13 10 - 24   Sodium 141 134 - 144 mmol/L   Potassium 3.6 3.5 - 5.2 mmol/L   Chloride 105 96 - 106 mmol/L   CO2 20 20 - 29 mmol/L   Calcium 9.2 8.6 - 10.2 mg/dL  LP+ALT+AST Piccolo, Waived  Result Value Ref Range   ALT (SGPT) Piccolo, Waived 20 10 - 47 U/L   AST (SGOT) Piccolo, Waived 24 11 - 38 U/L   Cholesterol Piccolo, Waived 131 <200 mg/dL   HDL Chol Piccolo, Waived 48 (L) >59 mg/dL   Triglycerides Piccolo,Waived 113 <150 mg/dL   Chol/HDL Ratio Piccolo,Waive 2.7 mg/dL   LDL Chol Calc Piccolo Waived 60 <100 mg/dL   VLDL Chol Calc Piccolo,Waive  23 <30 mg/dL      Assessment & Plan:   Problem List Items Addressed This Visit      Cardiovascular and Mediastinum   Hypertension    The current medical regimen is effective;  continue present plan and medications.       Relevant Medications   simvastatin (ZOCOR) 40 MG tablet   metoprolol tartrate (LOPRESSOR) 25 MG tablet   hydrochlorothiazide (HYDRODIURIL) 12.5 MG tablet   enalapril (VASOTEC) 10 MG tablet   CAD (coronary artery disease)    The current medical regimen is effective;  continue present plan and medications.       Relevant Medications   simvastatin (ZOCOR) 40 MG tablet   metoprolol tartrate (LOPRESSOR) 25 MG tablet   hydrochlorothiazide (HYDRODIURIL) 12.5 MG tablet   enalapril (VASOTEC) 10 MG tablet   clopidogrel (PLAVIX) 75 MG tablet   Purpura senilis (HCC)    The current medical regimen is effective;  continue present plan and medications.       Relevant Medications   simvastatin (ZOCOR) 40 MG tablet   metoprolol tartrate (LOPRESSOR) 25 MG tablet   hydrochlorothiazide (HYDRODIURIL) 12.5 MG tablet   enalapril (VASOTEC) 10 MG tablet     Musculoskeletal and Integument   Psoriasis  The current medical regimen is effective;  continue present plan and medications.         Other   Hyperlipidemia    The current medical regimen is effective;  continue present plan and medications.       Relevant Medications   simvastatin (ZOCOR) 40 MG tablet   metoprolol tartrate (LOPRESSOR) 25 MG tablet   hydrochlorothiazide (HYDRODIURIL) 12.5 MG tablet   enalapril (VASOTEC) 10 MG tablet   S/P TURP    The current medical regimen is effective;  continue present plan and medications.        Other Visit Diagnoses    PE (physical exam), annual    -  Primary   Relevant Orders   Comprehensive metabolic panel   Lipid panel   CBC with Differential/Platelet   TSH   Urinalysis, Routine w reflex microscopic   PSA   Benign prostatic hyperplasia without lower  urinary tract symptoms       Relevant Medications   tamsulosin (FLOMAX) 0.4 MG CAPS capsule   finasteride (PROSCAR) 5 MG tablet      Telemedicine using audio/video telecommunications for a synchronous communication visit. Today's visit due to COVID-19 isolation precautions I connected with and verified that I am speaking with the correct person using two identifiers.   I discussed the limitations, risks, security and privacy concerns of performing an evaluation and management service by telecommunication and the availability of in person appointments. I also discussed with the patient that there may be a patient responsible charge related to this service. The patient expressed understanding and agreed to proceed. The patient's location is work. I am at home.   I discussed the assessment and treatment plan with the patient. The patient was provided an opportunity to ask questions and all were answered. The patient agreed with the plan and demonstrated an understanding of the instructions.   The patient was advised to call back or seek an in-person evaluation if the symptoms worsen or if the condition fails to improve as anticipated.   I provided 21+ minutes of time during this encounter.  Follow up plan: Return in about 6 months (around 01/29/2019) for BMP,  Lipids, ALT, AST.

## 2018-09-14 DIAGNOSIS — G510 Bell's palsy: Secondary | ICD-10-CM | POA: Diagnosis not present

## 2018-09-14 DIAGNOSIS — Z87891 Personal history of nicotine dependence: Secondary | ICD-10-CM | POA: Diagnosis not present

## 2018-09-14 DIAGNOSIS — R402362 Coma scale, best motor response, obeys commands, at arrival to emergency department: Secondary | ICD-10-CM | POA: Diagnosis not present

## 2018-09-14 DIAGNOSIS — R51 Headache: Secondary | ICD-10-CM | POA: Diagnosis not present

## 2018-09-14 DIAGNOSIS — Z955 Presence of coronary angioplasty implant and graft: Secondary | ICD-10-CM | POA: Diagnosis not present

## 2018-09-14 DIAGNOSIS — R29810 Facial weakness: Secondary | ICD-10-CM | POA: Diagnosis not present

## 2018-09-16 ENCOUNTER — Encounter: Payer: BC Managed Care – PPO | Admitting: Family Medicine

## 2018-09-30 DIAGNOSIS — I208 Other forms of angina pectoris: Secondary | ICD-10-CM | POA: Diagnosis not present

## 2018-09-30 DIAGNOSIS — I2510 Atherosclerotic heart disease of native coronary artery without angina pectoris: Secondary | ICD-10-CM | POA: Diagnosis not present

## 2018-09-30 DIAGNOSIS — I10 Essential (primary) hypertension: Secondary | ICD-10-CM | POA: Diagnosis not present

## 2018-09-30 DIAGNOSIS — E785 Hyperlipidemia, unspecified: Secondary | ICD-10-CM | POA: Diagnosis not present

## 2018-09-30 DIAGNOSIS — Z79899 Other long term (current) drug therapy: Secondary | ICD-10-CM | POA: Diagnosis not present

## 2018-10-27 ENCOUNTER — Ambulatory Visit (INDEPENDENT_AMBULATORY_CARE_PROVIDER_SITE_OTHER): Payer: BC Managed Care – PPO | Admitting: Family Medicine

## 2018-10-27 ENCOUNTER — Other Ambulatory Visit: Payer: Self-pay

## 2018-10-27 ENCOUNTER — Encounter: Payer: Self-pay | Admitting: Family Medicine

## 2018-10-27 DIAGNOSIS — Z0000 Encounter for general adult medical examination without abnormal findings: Secondary | ICD-10-CM

## 2018-10-27 LAB — MICROSCOPIC EXAMINATION
Bacteria, UA: NONE SEEN
WBC, UA: NONE SEEN /hpf (ref 0–5)

## 2018-10-27 LAB — URINALYSIS, ROUTINE W REFLEX MICROSCOPIC
Bilirubin, UA: NEGATIVE
Ketones, UA: NEGATIVE
Leukocytes,UA: NEGATIVE
Nitrite, UA: NEGATIVE
Protein,UA: NEGATIVE
Specific Gravity, UA: 1.02 (ref 1.005–1.030)
Urobilinogen, Ur: 1 mg/dL (ref 0.2–1.0)
pH, UA: 5.5 (ref 5.0–7.5)

## 2018-10-27 NOTE — Progress Notes (Signed)
BP 119/76   Pulse 69   Temp 98.4 F (36.9 C) (Oral)   Ht 5' 7.5" (1.715 m)   Wt 185 lb (83.9 kg)   SpO2 95%   BMI 28.55 kg/m    Subjective:    Patient ID: Robert Zuniga, male    DOB: 09-29-1955, 63 y.o.   MRN: [id number]  HPI: Robert Zuniga is a 64 y.o. male presenting on 10/27/2018 for comprehensive medical examination. Current medical complaints include:none  He currently lives with: Interim Problems from his last visit: no  Depression Screen done today and results listed below:  Depression screen [facility] 2/9 10/27/2018 06/17/2017 05/05/2016 11/06/2015  Decreased Interest 0 0 0 0  Down, Depressed, Hopeless 0 0 0 0  PHQ - 2 Score 0 0 0 0  Altered sleeping 0 - - -  Tired, decreased energy 0 - - -  Change in appetite 0 - - -  Feeling bad or failure about yourself  0 - - -  Trouble concentrating 0 - - -  Moving slowly or fidgety/restless 0 - - -  Suicidal thoughts 0 - - -  PHQ-9 Score 0 - - -    The patient does not have a history of falls. I did complete a risk assessment for falls. A plan of care for falls was documented.   Past Medical History:  Past Medical History:  Diagnosis Date  . Bladder hemorrhage 2015  . CAD (coronary artery disease)   . Hyperlipidemia   . Hypertension   . MI (myocardial infarction) ([place])    X3  . Psoriasis     Surgical History:  Past Surgical History:  Procedure Laterality Date  . CORONARY ANGIOPLASTY WITH STENT PLACEMENT    . CYSTOSTOMY W/ BLADDER BIOPSY    . JOINT REPLACEMENT Bilateral    hips  . PROSTATE SURGERY  2015   polyps -resolved    Medications:  Current Outpatient Medications on File Prior to Visit  Medication Sig  . aspirin EC 81 MG tablet Take 81 mg by mouth daily.  . betamethasone dipropionate (DIPROLENE) 0.05 % cream APPLY TO AFFECTED AREA(S) TWO TIMES A DAY  . betamethasone dipropionate 0.05 % lotion APPLY TO AFFECTED AREA(S) AS DIRECTED. DO NOT USE FOR MORE THAN 2 WEEKS IN THE SAME AREA  . clopidogrel (PLAVIX)  75 MG tablet Take 1 tablet (75 mg total) by mouth daily.  Marland Kitchen ELMIRON 100 MG capsule   . enalapril (VASOTEC) 10 MG tablet Take 1 tablet (10 mg total) by mouth daily.  . finasteride (PROSCAR) 5 MG tablet Take 1 tablet (5 mg total) by mouth at bedtime.  . hydrochlorothiazide (HYDRODIURIL) 12.5 MG tablet Take 1 tablet (12.5 mg total) by mouth daily.  Marland Kitchen ketoconazole (NIZORAL) 2 % shampoo Apply 1 application topically 2 (two) times a week. BRAND NAME NIZORAL NECESSARY  . metoprolol tartrate (LOPRESSOR) 25 MG tablet Take 1 tablet (25 mg total) by mouth daily.  . naproxen sodium (ANAPROX) 220 MG tablet Take 220 mg by mouth as needed.  . simvastatin (ZOCOR) 40 MG tablet Take 1 tablet (40 mg total) by mouth daily.  . tamsulosin (FLOMAX) 0.4 MG CAPS capsule Take 1 capsule (0.4 mg total) by mouth daily.  . calcium & magnesium carbonates (MYLANTA) [id number] MG tablet Take 1 tablet by mouth daily.   No current facility-administered medications on file prior to visit.     Allergies:  Allergies  Allergen Reactions  . Morphine Nausea Only and Nausea And Vomiting  . Haemophilus  Influenzae Other (See Comments)    Joint pain Joint pain  . Influenza Vaccine Recombinant Other (See Comments)    Joint pain  . Levaquin [Levofloxacin In D5w] Nausea And Vomiting  . Niaspan Durene Cal Er] Hives    Social History:  Social History   Socioeconomic History  . Marital status: Married    Spouse name: Not on file  . Number of children: Not on file  . Years of education: Not on file  . Highest education level: Not on file  Occupational History  . Not on file  Social Needs  . Financial resource strain: Not on file  . Food insecurity    Worry: Not on file    Inability: Not on file  . Transportation needs    Medical: Not on file    Non-medical: Not on file  Tobacco Use  . Smoking status: Former Smoker    Packs/day: 1.00    Years: 2.00    Pack years: 2.00    Types: Cigarettes    Quit date: 09/12/1987    Years  since quitting: 31.1  . Smokeless tobacco: Never Used  Substance and Sexual Activity  . Alcohol use: No    Alcohol/week: 0.0 standard drinks  . Drug use: No  . Sexual activity: Not on file  Lifestyle  . Physical activity    Days per week: Not on file    Minutes per session: Not on file  . Stress: Not on file  Relationships  . Social Herbalist on phone: Not on file    Gets together: Not on file    Attends religious service: Not on file    Active member of club or organization: Not on file    Attends meetings of clubs or organizations: Not on file    Relationship status: Not on file  . Intimate partner violence    Fear of current or ex partner: Not on file    Emotionally abused: Not on file    Physically abused: Not on file    Forced sexual activity: Not on file  Other Topics Concern  . Not on file  Social History Narrative  . Not on file   Social History   Tobacco Use  Smoking Status Former Smoker  . Packs/day: 1.00  . Years: 2.00  . Pack years: 2.00  . Types: Cigarettes  . Quit date: 09/12/1987  . Years since quitting: 31.1  Smokeless Tobacco Never Used   Social History   Substance and Sexual Activity  Alcohol Use No  . Alcohol/week: 0.0 standard drinks    Family History:  Family History  Problem Relation Age of Onset  . Diabetes Mother   . Dementia Mother   . Heart disease Father   . Cancer Sister   . Liver disease Sister   . Heart disease Maternal Grandfather   . Heart disease Paternal Grandfather     Past medical history, surgical history, medications, allergies, family history and social history reviewed with patient today and changes made to appropriate areas of the chart.   Review of Systems - General ROS: negative Psychological ROS: negative Ophthalmic ROS: negative ENT ROS: negative Allergy and Immunology ROS: negative Hematological and Lymphatic ROS: negative Endocrine ROS: negative Breast ROS: negative for breast lumps  Respiratory ROS: no cough, shortness of breath, or wheezing Cardiovascular ROS: no chest pain or dyspnea on exertion Gastrointestinal ROS: no abdominal pain, change in bowel habits, or black or bloody stools Genito-Urinary ROS: no dysuria, trouble  voiding, or hematuria Musculoskeletal ROS: negative Neurological ROS: no TIA or stroke symptoms Dermatological ROS: negative All other ROS negative except what is listed above and in the HPI.      Objective:    BP 119/76   Pulse 69   Temp 98.4 F (36.9 C) (Oral)   Ht 5' 7.5" (1.715 m)   Wt 185 lb (83.9 kg)   SpO2 95%   BMI 28.55 kg/m   Wt Readings from Last 3 Encounters:  10/27/18 185 lb (83.9 kg)  12/28/17 183 lb (83 kg)  11/23/17 180 lb 4 oz (81.8 kg)    Physical Exam Vitals signs and nursing note reviewed.  Constitutional:      General: He is not in acute distress.    Appearance: He is well-developed.  HENT:     Head: Atraumatic.     Right Ear: Tympanic membrane and external ear normal.     Left Ear: Tympanic membrane and external ear normal.     Nose: Nose normal.     Mouth/Throat:     Mouth: Mucous membranes are moist.     Pharynx: Oropharynx is clear.  Eyes:     General: No scleral icterus.    Conjunctiva/sclera: Conjunctivae normal.     Pupils: Pupils are equal, round, and reactive to light.  Neck:     Musculoskeletal: Normal range of motion and neck supple.  Cardiovascular:     Rate and Rhythm: Normal rate and regular rhythm.     Heart sounds: Normal heart sounds. No murmur.  Pulmonary:     Effort: Pulmonary effort is normal. No respiratory distress.     Breath sounds: Normal breath sounds.  Abdominal:     General: Bowel sounds are normal. There is no distension.     Palpations: Abdomen is soft. There is no mass.     Tenderness: There is no abdominal tenderness. There is no guarding.  Genitourinary:    Comments: GU exam done through Urology Musculoskeletal: Normal range of motion.        General: No  tenderness.  Skin:    General: Skin is warm and dry.     Findings: No rash.  Neurological:     General: No focal deficit present.     Mental Status: He is alert and oriented to person, place, and time.     Deep Tendon Reflexes: Reflexes are normal and symmetric.  Psychiatric:        Mood and Affect: Mood normal.        Behavior: Behavior normal.        Thought Content: Thought content normal.        Judgment: Judgment normal.     Results for orders placed or performed in visit on 10/27/18  Microscopic Examination   URINE  Result Value Ref Range   WBC, UA None seen 0 - 5 /hpf   RBC 0-2 0 - 2 /hpf   Epithelial Cells (non renal) 0-10 0 - 10 /hpf   Bacteria, UA None seen None seen/Few  PSA  Result Value Ref Range   Prostate Specific Ag, Serum 0.8 0.0 - 4.0 ng/mL  Urinalysis, Routine w reflex microscopic  Result Value Ref Range   Specific Gravity, UA 1.020 1.005 - 1.030   pH, UA 5.5 5.0 - 7.5   Color, UA Yellow Yellow   Appearance Ur Clear Clear   Leukocytes,UA Negative Negative   Protein,UA Negative Negative/Trace   Glucose, UA 1+ (A) Negative   Ketones, UA Negative  Negative   RBC, UA Trace (A) Negative   Bilirubin, UA Negative Negative   Urobilinogen, Ur 1.0 0.2 - 1.0 mg/dL   Nitrite, UA Negative Negative   Microscopic Examination See below:   TSH  Result Value Ref Range   TSH 1.000 0.450 - 4.500 uIU/mL  CBC with Differential/Platelet  Result Value Ref Range   WBC 4.8 3.4 - 10.8 x10E3/uL   RBC 5.50 4.14 - 5.80 x10E6/uL   Hemoglobin 14.5 13.0 - 17.7 g/dL   Hematocrit 43.7 37.5 - 51.0 %   MCV 80 79 - 97 fL   MCH 26.4 (L) 26.6 - 33.0 pg   MCHC 33.2 31.5 - 35.7 g/dL   RDW 14.9 11.6 - 15.4 %   Platelets 180 150 - 450 x10E3/uL   Neutrophils 62 Not Estab. %   Lymphs 25 Not Estab. %   Monocytes 9 Not Estab. %   Eos 3 Not Estab. %   Basos 1 Not Estab. %   Neutrophils Absolute 2.9 1.4 - 7.0 x10E3/uL   Lymphocytes Absolute 1.2 0.7 - 3.1 x10E3/uL   Monocytes Absolute  0.4 0.1 - 0.9 x10E3/uL   EOS (ABSOLUTE) 0.1 0.0 - 0.4 x10E3/uL   Basophils Absolute 0.0 0.0 - 0.2 x10E3/uL   Immature Granulocytes 0 Not Estab. %   Immature Grans (Abs) 0.0 0.0 - 0.1 x10E3/uL  Lipid panel  Result Value Ref Range   Cholesterol, Total 149 100 - 199 mg/dL   Triglycerides 85 0 - 149 mg/dL   HDL 50 >39 mg/dL   VLDL Cholesterol Cal 16 5 - 40 mg/dL   LDL Chol Calc (NIH) 83 0 - 99 mg/dL   Chol/HDL Ratio 3.0 0.0 - 5.0 ratio  Comprehensive metabolic panel  Result Value Ref Range   Glucose 108 (H) 65 - 99 mg/dL   BUN 14 8 - 27 mg/dL   Creatinine, Ser 0.79 0.76 - 1.27 mg/dL   GFR calc non Af Amer 95 >59 mL/min/1.73   GFR calc Af Amer 110 >59 mL/min/1.73   BUN/Creatinine Ratio 18 10 - 24   Sodium 141 134 - 144 mmol/L   Potassium 4.3 3.5 - 5.2 mmol/L   Chloride 105 96 - 106 mmol/L   CO2 24 20 - 29 mmol/L   Calcium 9.7 8.6 - 10.2 mg/dL   Total Protein 6.5 6.0 - 8.5 g/dL   Albumin 4.2 3.8 - 4.8 g/dL   Globulin, Total 2.3 1.5 - 4.5 g/dL   Albumin/Globulin Ratio 1.8 1.2 - 2.2   Bilirubin Total 0.6 0.0 - 1.2 mg/dL   Alkaline Phosphatase 125 (H) 39 - 117 IU/L   AST 22 0 - 40 IU/L   ALT 21 0 - 44 IU/L      Assessment & Plan:   Problem List Items Addressed This Visit    None    Visit Diagnoses    PE (physical exam), annual           Discussed aspirin prophylaxis for myocardial infarction prevention and decision was made to continue ASA  LABORATORY TESTING:  Health maintenance labs ordered today as discussed above.   The natural history of prostate cancer and ongoing controversy regarding screening and potential treatment outcomes of prostate cancer has been discussed with the patient. The meaning of a false positive PSA and a false negative PSA has been discussed. He indicates understanding of the limitations of this screening test and wishes to proceed with screening PSA testing.   IMMUNIZATIONS:   - Tdap: Tetanus vaccination  status reviewed: postponed. -  Influenza: Refused - HPV: Not applicable - Zostavax vaccine: Refused  SCREENING: - Colonoscopy: Refused  Discussed with patient purpose of the colonoscopy is to detect colon cancer at curable precancerous or early stages   PATIENT COUNSELING:    Sexuality: Discussed sexually transmitted diseases, partner selection, use of condoms, avoidance of unintended pregnancy  and contraceptive alternatives.   Advised to avoid cigarette smoking.  I discussed with the patient that most people either abstain from alcohol or drink within safe limits (<=14/week and <=4 drinks/occasion for males, <=7/weeks and <= 3 drinks/occasion for females) and that the risk for alcohol disorders and other health effects rises proportionally with the number of drinks per week and how often a drinker exceeds daily limits.  Discussed cessation/primary prevention of drug use and availability of treatment for abuse.   Diet: Encouraged to adjust caloric intake to maintain  or achieve ideal body weight, to reduce intake of dietary saturated fat and total fat, to limit sodium intake by avoiding high sodium foods and not adding table salt, and to maintain adequate dietary potassium and calcium preferably from fresh fruits, vegetables, and low-fat dairy products.    stressed the importance of regular exercise  Injury prevention: Discussed safety belts, safety helmets, smoke detector, smoking near bedding or upholstery.   Dental health: Discussed importance of regular tooth brushing, flossing, and dental visits.   Follow up plan: NEXT PREVENTATIVE PHYSICAL DUE IN 1 YEAR. Return in about 6 months (around 04/27/2019) for 6 month f/u.

## 2018-10-28 LAB — COMPREHENSIVE METABOLIC PANEL
ALT: 21 IU/L (ref 0–44)
AST: 22 IU/L (ref 0–40)
Albumin/Globulin Ratio: 1.8 (ref 1.2–2.2)
Albumin: 4.2 g/dL (ref 3.8–4.8)
Alkaline Phosphatase: 125 IU/L — ABNORMAL HIGH (ref 39–117)
BUN/Creatinine Ratio: 18 (ref 10–24)
BUN: 14 mg/dL (ref 8–27)
Bilirubin Total: 0.6 mg/dL (ref 0.0–1.2)
CO2: 24 mmol/L (ref 20–29)
Calcium: 9.7 mg/dL (ref 8.6–10.2)
Chloride: 105 mmol/L (ref 96–106)
Creatinine, Ser: 0.79 mg/dL (ref 0.76–1.27)
GFR calc Af Amer: 110 mL/min/{1.73_m2} (ref >59)
GFR calc non Af Amer: 95 mL/min/{1.73_m2} (ref >59)
Globulin, Total: 2.3 g/dL (ref 1.5–4.5)
Glucose: 108 mg/dL — ABNORMAL HIGH (ref 65–99)
Potassium: 4.3 mmol/L (ref 3.5–5.2)
Sodium: 141 mmol/L (ref 134–144)
Total Protein: 6.5 g/dL (ref 6.0–8.5)

## 2018-10-28 LAB — CBC WITH DIFFERENTIAL/PLATELET
Basophils Absolute: 0 10*3/uL (ref 0.0–0.2)
Basos: 1 %
EOS (ABSOLUTE): 0.1 10*3/uL (ref 0.0–0.4)
Eos: 3 %
Hematocrit: 43.7 % (ref 37.5–51.0)
Hemoglobin: 14.5 g/dL (ref 13.0–17.7)
Immature Grans (Abs): 0 10*3/uL (ref 0.0–0.1)
Immature Granulocytes: 0 %
Lymphocytes Absolute: 1.2 10*3/uL (ref 0.7–3.1)
Lymphs: 25 %
MCH: 26.4 pg — ABNORMAL LOW (ref 26.6–33.0)
MCHC: 33.2 g/dL (ref 31.5–35.7)
MCV: 80 fL (ref 79–97)
Monocytes Absolute: 0.4 10*3/uL (ref 0.1–0.9)
Monocytes: 9 %
Neutrophils Absolute: 2.9 10*3/uL (ref 1.4–7.0)
Neutrophils: 62 %
Platelets: 180 10*3/uL (ref 150–450)
RBC: 5.5 x10E6/uL (ref 4.14–5.80)
RDW: 14.9 % (ref 11.6–15.4)
WBC: 4.8 10*3/uL (ref 3.4–10.8)

## 2018-10-28 LAB — LIPID PANEL
Chol/HDL Ratio: 3 ratio (ref 0.0–5.0)
Cholesterol, Total: 149 mg/dL (ref 100–199)
HDL: 50 mg/dL (ref >39)
LDL Chol Calc (NIH): 83 mg/dL (ref 0–99)
Triglycerides: 85 mg/dL (ref 0–149)
VLDL Cholesterol Cal: 16 mg/dL (ref 5–40)

## 2018-10-28 LAB — PSA: Prostate Specific Ag, Serum: 0.8 ng/mL (ref 0.0–4.0)

## 2018-10-28 LAB — TSH: TSH: 1 u[IU]/mL (ref 0.450–4.500)

## 2019-02-07 ENCOUNTER — Ambulatory Visit: Payer: BC Managed Care – PPO | Admitting: Family Medicine

## 2019-04-04 DIAGNOSIS — E785 Hyperlipidemia, unspecified: Secondary | ICD-10-CM | POA: Diagnosis not present

## 2019-04-04 DIAGNOSIS — I208 Other forms of angina pectoris: Secondary | ICD-10-CM | POA: Diagnosis not present

## 2019-04-04 DIAGNOSIS — I2510 Atherosclerotic heart disease of native coronary artery without angina pectoris: Secondary | ICD-10-CM | POA: Diagnosis not present

## 2019-04-04 DIAGNOSIS — I10 Essential (primary) hypertension: Secondary | ICD-10-CM | POA: Diagnosis not present

## 2019-04-27 ENCOUNTER — Encounter: Payer: Self-pay | Admitting: Family Medicine

## 2019-04-27 ENCOUNTER — Ambulatory Visit (INDEPENDENT_AMBULATORY_CARE_PROVIDER_SITE_OTHER): Payer: BC Managed Care – PPO | Admitting: Family Medicine

## 2019-04-27 ENCOUNTER — Other Ambulatory Visit: Payer: Self-pay

## 2019-04-27 VITALS — BP 135/86 | HR 64 | Temp 98.3°F | Wt 180.0 lb

## 2019-04-27 DIAGNOSIS — Z9079 Acquired absence of other genital organ(s): Secondary | ICD-10-CM

## 2019-04-27 DIAGNOSIS — R300 Dysuria: Secondary | ICD-10-CM | POA: Diagnosis not present

## 2019-04-27 DIAGNOSIS — E782 Mixed hyperlipidemia: Secondary | ICD-10-CM | POA: Diagnosis not present

## 2019-04-27 DIAGNOSIS — I10 Essential (primary) hypertension: Secondary | ICD-10-CM | POA: Diagnosis not present

## 2019-04-27 DIAGNOSIS — I2583 Coronary atherosclerosis due to lipid rich plaque: Secondary | ICD-10-CM | POA: Diagnosis not present

## 2019-04-27 DIAGNOSIS — I2510 Atherosclerotic heart disease of native coronary artery without angina pectoris: Secondary | ICD-10-CM

## 2019-04-27 DIAGNOSIS — R81 Glycosuria: Secondary | ICD-10-CM

## 2019-04-27 MED ORDER — SULFAMETHOXAZOLE-TRIMETHOPRIM 800-160 MG PO TABS: 1.0000 | ORAL_TABLET | Freq: Two times a day (BID) | ORAL | 0 refills | Status: DC

## 2019-04-27 MED ORDER — PHENAZOPYRIDINE HCL 200 MG PO TABS: 200.0000 mg | ORAL_TABLET | Freq: Three times a day (TID) | ORAL | 0 refills | Status: DC | PRN

## 2019-04-27 NOTE — Progress Notes (Signed)
BP 135/86   Pulse 64   Temp 98.3 F (36.8 C) (Oral)   Wt 180 lb (81.6 kg)   SpO2 97%   BMI 27.78 kg/m    Subjective:    Patient ID: Robert Zuniga, male    DOB: 08-Oct-1955, 65 y.o.   MRN: [id number]  HPI: Robert Zuniga is a 64 y.o. male  Chief Complaint  Patient presents with  . Hypertension  . Hyperlipidemia   Here today for 6 month f/u chronic conditions.   Taking medications faithfully without side effects. Denies CP, SOB, HAs, dizziness, claudication, myalgias. Trying to eat well and stay active but not strict on either. Not closely following home BPs.   Having several days of significant dysuria and frequency, hx of BPH s/p TURP in 2019 with good resolution of sxs until now. Denies hematuria, fevers, abdominal pain, N/V.   Relevant past medical, surgical, family and social history reviewed and updated as indicated. Interim medical history since our last visit reviewed. Allergies and medications reviewed and updated.  Review of Systems  Per HPI unless specifically indicated above     Objective:    BP 135/86   Pulse 64   Temp 98.3 F (36.8 C) (Oral)   Wt 180 lb (81.6 kg)   SpO2 97%   BMI 27.78 kg/m   Wt Readings from Last 3 Encounters:  04/27/19 180 lb (81.6 kg)  10/27/18 185 lb (83.9 kg)  12/28/17 183 lb (83 kg)    Physical Exam Vitals and nursing note reviewed.  Constitutional:      Appearance: Normal appearance.  HENT:     Head: Atraumatic.  Eyes:     Extraocular Movements: Extraocular movements intact.     Conjunctiva/sclera: Conjunctivae normal.  Cardiovascular:     Rate and Rhythm: Normal rate and regular rhythm.  Pulmonary:     Effort: Pulmonary effort is normal.     Breath sounds: Normal breath sounds.  Abdominal:     General: Bowel sounds are normal. There is no distension.     Palpations: Abdomen is soft.     Tenderness: There is no abdominal tenderness. There is no right CVA tenderness, left CVA tenderness or guarding.    Musculoskeletal:        General: Normal range of motion.     Cervical back: Normal range of motion and neck supple.  Skin:    General: Skin is warm and dry.  Neurological:     General: No focal deficit present.     Mental Status: He is oriented to person, place, and time.  Psychiatric:        Mood and Affect: Mood normal.        Thought Content: Thought content normal.        Judgment: Judgment normal.     Results for orders placed or performed in visit on 04/27/19  Microscopic Examination   URINE  Result Value Ref Range   WBC, UA 0-5 0 - 5 /hpf   RBC 3-10 (A) 0 - 2 /hpf   Epithelial Cells (non renal) 0-10 0 - 10 /hpf   Bacteria, UA None seen None seen/Few  Urine Culture, Reflex   URINE  Result Value Ref Range   Urine Culture, Routine WILL FOLLOW   Comprehensive metabolic panel  Result Value Ref Range   Glucose 119 (H) 65 - 99 mg/dL   BUN 12 8 - 27 mg/dL   Creatinine, Ser 0.79 0.76 - 1.27 mg/dL   GFR calc non Af  Amer 94 >59 mL/min/1.73   GFR calc Af Amer 109 >59 mL/min/1.73   BUN/Creatinine Ratio 15 10 - 24   Sodium 141 134 - 144 mmol/L   Potassium 4.1 3.5 - 5.2 mmol/L   Chloride 108 (H) 96 - 106 mmol/L   CO2 22 20 - 29 mmol/L   Calcium 8.8 8.6 - 10.2 mg/dL   Total Protein 6.0 6.0 - 8.5 g/dL   Albumin 4.2 3.8 - 4.8 g/dL   Globulin, Total 1.8 1.5 - 4.5 g/dL   Albumin/Globulin Ratio 2.3 (H) 1.2 - 2.2   Bilirubin Total 0.5 0.0 - 1.2 mg/dL   Alkaline Phosphatase 120 (H) 39 - 117 IU/L   AST 22 0 - 40 IU/L   ALT 17 0 - 44 IU/L  Lipid Panel w/o Chol/HDL Ratio  Result Value Ref Range   Cholesterol, Total 130 100 - 199 mg/dL   Triglycerides 99 0 - 149 mg/dL   HDL 43 >39 mg/dL   VLDL Cholesterol Cal 19 5 - 40 mg/dL   LDL Chol Calc (NIH) 68 0 - 99 mg/dL  UA/M w/rflx Culture, Routine   Specimen: Urine   URINE  Result Value Ref Range   Specific Gravity, UA 1.025 1.005 - 1.030   pH, UA 6.0 5.0 - 7.5   Color, UA Yellow Yellow   Appearance Ur Clear Clear    Leukocytes,UA Negative Negative   Protein,UA 1+ (A) Negative/Trace   Glucose, UA 2+ (A) Negative   Ketones, UA Negative Negative   RBC, UA Trace (A) Negative   Bilirubin, UA Negative Negative   Urobilinogen, Ur >8.0 (H) 0.2 - 1.0 mg/dL   Nitrite, UA Negative Negative   Microscopic Examination See below:   Bayer DCA Hb A1c Waived  Result Value Ref Range   HB A1C (BAYER DCA - WAIVED) 6.1 <7.0 %      Assessment & Plan:   Problem List Items Addressed This Visit      Cardiovascular and Mediastinum   Hypertension - Primary    BPs stable and under good control, continue current regimen      Relevant Orders   Comprehensive metabolic panel (Completed)   CAD (coronary artery disease)    Recheck lipids, continue current regimen and work on lifestyle modifications      Relevant Orders   Lipid Panel w/o Chol/HDL Ratio (Completed)     Other   Hyperlipidemia    Recheck lipids, adjust as needed. Continue current regimen      S/P TURP    Stable until several days ago. U/A without obvious evidence of infection, but given sxs will tx with abx and fluids and monitor closely. F/u with Urology if not improving       Other Visit Diagnoses    Dysuria       Tx for bacterial infection despite neg U/A given sxs and prostate hx   Relevant Orders   UA/M w/rflx Culture, Routine (Completed)   Glucosuria       Per pt this is a long standing finding for him, no A1C on file. Will run A1C and treat as needed   Relevant Orders   Bayer DCA Hb A1c Waived (Completed)       Follow up plan: Return in about 6 months (around 10/27/2019).

## 2019-04-28 LAB — UA/M W/RFLX CULTURE, ROUTINE
Bilirubin, UA: NEGATIVE
Ketones, UA: NEGATIVE
Leukocytes,UA: NEGATIVE
Nitrite, UA: NEGATIVE
Specific Gravity, UA: 1.025 (ref 1.005–1.030)
Urobilinogen, Ur: >8 mg/dL — ABNORMAL HIGH (ref 0.2–1.0)
pH, UA: 6 (ref 5.0–7.5)

## 2019-04-28 LAB — MICROSCOPIC EXAMINATION: Bacteria, UA: NONE SEEN

## 2019-04-28 LAB — COMPREHENSIVE METABOLIC PANEL
ALT: 17 IU/L (ref 0–44)
AST: 22 IU/L (ref 0–40)
Albumin/Globulin Ratio: 2.3 — ABNORMAL HIGH (ref 1.2–2.2)
Albumin: 4.2 g/dL (ref 3.8–4.8)
Alkaline Phosphatase: 120 IU/L — ABNORMAL HIGH (ref 39–117)
BUN/Creatinine Ratio: 15 (ref 10–24)
BUN: 12 mg/dL (ref 8–27)
Bilirubin Total: 0.5 mg/dL (ref 0.0–1.2)
CO2: 22 mmol/L (ref 20–29)
Calcium: 8.8 mg/dL (ref 8.6–10.2)
Chloride: 108 mmol/L — ABNORMAL HIGH (ref 96–106)
Creatinine, Ser: 0.79 mg/dL (ref 0.76–1.27)
GFR calc Af Amer: 109 mL/min/{1.73_m2} (ref >59)
GFR calc non Af Amer: 94 mL/min/{1.73_m2} (ref >59)
Globulin, Total: 1.8 g/dL (ref 1.5–4.5)
Glucose: 119 mg/dL — ABNORMAL HIGH (ref 65–99)
Potassium: 4.1 mmol/L (ref 3.5–5.2)
Sodium: 141 mmol/L (ref 134–144)
Total Protein: 6 g/dL (ref 6.0–8.5)

## 2019-04-28 LAB — LIPID PANEL W/O CHOL/HDL RATIO
Cholesterol, Total: 130 mg/dL (ref 100–199)
HDL: 43 mg/dL (ref >39)
LDL Chol Calc (NIH): 68 mg/dL (ref 0–99)
Triglycerides: 99 mg/dL (ref 0–149)
VLDL Cholesterol Cal: 19 mg/dL (ref 5–40)

## 2019-04-28 LAB — BAYER DCA HB A1C WAIVED: HB A1C (BAYER DCA - WAIVED): 6.1 % (ref <7.0)

## 2019-04-28 LAB — URINE CULTURE, REFLEX

## 2019-04-29 ENCOUNTER — Encounter: Payer: Self-pay | Admitting: Family Medicine

## 2019-04-29 DIAGNOSIS — R7301 Impaired fasting glucose: Secondary | ICD-10-CM | POA: Insufficient documentation

## 2019-05-03 NOTE — Assessment & Plan Note (Signed)
BPs stable and under good control, continue current regimen 

## 2019-05-03 NOTE — Assessment & Plan Note (Signed)
Recheck lipids, adjust as needed. Continue current regimen 

## 2019-05-03 NOTE — Assessment & Plan Note (Signed)
Stable until several days ago. U/A without obvious evidence of infection, but given sxs will tx with abx and fluids and monitor closely. F/u with Urology if not improving

## 2019-05-03 NOTE — Assessment & Plan Note (Signed)
Recheck lipids, continue current regimen and work on lifestyle modifications

## 2019-06-12 ENCOUNTER — Other Ambulatory Visit: Payer: Self-pay | Admitting: Family Medicine

## 2019-06-12 NOTE — Telephone Encounter (Signed)
Requested Prescriptions  Pending Prescriptions Disp Refills  . ketoconazole (NIZORAL) 2 % shampoo [Pharmacy Med Name: KETOCONAZOLE 2% SHAMPOO] 120 mL 3    Sig: USE 1 APPLICATION TOPICALLY TWICE A WEEK.     Over the Counter:  OTC Passed - 06/12/2019  1:16 PM      Passed - Valid encounter within last 12 months    Recent Outpatient Visits          1 month ago Essential hypertension   [facility], [place]   7 months ago PE (physical exam), annual   [facility], [place]   10 months ago PE (physical exam), annual   [place], Jeannette How, [place]   1 year ago Essential hypertension   [place], Jeannette How, [place]   1 year ago Gross hematuria   [facility], [place]      Future Appointments            In 4 months Orene Desanctis, Lilia Argue, [place], [place]

## 2019-06-21 ENCOUNTER — Telehealth: Payer: Self-pay | Admitting: Family Medicine

## 2019-06-21 MED ORDER — BETAMETHASONE DIPROPIONATE 0.05 % EX CREA: TOPICAL_CREAM | CUTANEOUS | 5 refills | Status: DC

## 2019-06-21 NOTE — Telephone Encounter (Signed)
Rx sent 

## 2019-06-21 NOTE — Telephone Encounter (Signed)
Pt states he needs a rx of his Diprolene 0.05 cream.Pt states he was suppose to receive it at last visit but did not.

## 2019-08-02 ENCOUNTER — Other Ambulatory Visit: Payer: Self-pay

## 2019-08-02 DIAGNOSIS — E782 Mixed hyperlipidemia: Secondary | ICD-10-CM

## 2019-08-02 DIAGNOSIS — I10 Essential (primary) hypertension: Secondary | ICD-10-CM

## 2019-08-02 DIAGNOSIS — I2510 Atherosclerotic heart disease of native coronary artery without angina pectoris: Secondary | ICD-10-CM

## 2019-08-02 DIAGNOSIS — N400 Enlarged prostate without lower urinary tract symptoms: Secondary | ICD-10-CM

## 2019-08-02 MED ORDER — TAMSULOSIN HCL 0.4 MG PO CAPS: 0.4000 mg | ORAL_CAPSULE | Freq: Every day | ORAL | 1 refills | Status: DC

## 2019-08-02 MED ORDER — CLOPIDOGREL BISULFATE 75 MG PO TABS: 75.0000 mg | ORAL_TABLET | Freq: Every day | ORAL | 1 refills | Status: DC

## 2019-08-02 MED ORDER — METOPROLOL TARTRATE 25 MG PO TABS: 25.0000 mg | ORAL_TABLET | Freq: Every day | ORAL | 1 refills | Status: DC

## 2019-08-02 MED ORDER — SIMVASTATIN 40 MG PO TABS: 40.0000 mg | ORAL_TABLET | Freq: Every day | ORAL | 1 refills | Status: DC

## 2019-08-02 MED ORDER — ENALAPRIL MALEATE 10 MG PO TABS: 10.0000 mg | ORAL_TABLET | Freq: Every day | ORAL | 1 refills | Status: DC

## 2019-08-02 MED ORDER — HYDROCHLOROTHIAZIDE 12.5 MG PO TABS: 12.5000 mg | ORAL_TABLET | Freq: Every day | ORAL | 1 refills | Status: DC

## 2019-08-02 MED ORDER — FINASTERIDE 5 MG PO TABS: 5.0000 mg | ORAL_TABLET | Freq: Every day | ORAL | 1 refills | Status: DC

## 2019-08-02 NOTE — Telephone Encounter (Signed)
Patient last seen 04/27/19 and has appointment 11/04/19

## 2019-08-15 DIAGNOSIS — Z96643 Presence of artificial hip joint, bilateral: Secondary | ICD-10-CM | POA: Diagnosis not present

## 2019-08-15 DIAGNOSIS — M5442 Lumbago with sciatica, left side: Secondary | ICD-10-CM | POA: Diagnosis not present

## 2019-09-07 DIAGNOSIS — M5442 Lumbago with sciatica, left side: Secondary | ICD-10-CM | POA: Diagnosis not present

## 2019-10-05 DIAGNOSIS — I2510 Atherosclerotic heart disease of native coronary artery without angina pectoris: Secondary | ICD-10-CM | POA: Diagnosis not present

## 2019-10-05 DIAGNOSIS — I208 Other forms of angina pectoris: Secondary | ICD-10-CM | POA: Diagnosis not present

## 2019-10-05 DIAGNOSIS — I10 Essential (primary) hypertension: Secondary | ICD-10-CM | POA: Diagnosis not present

## 2019-10-05 DIAGNOSIS — Z23 Encounter for immunization: Secondary | ICD-10-CM | POA: Diagnosis not present

## 2019-11-04 ENCOUNTER — Ambulatory Visit (INDEPENDENT_AMBULATORY_CARE_PROVIDER_SITE_OTHER): Payer: BC Managed Care – PPO | Admitting: Family Medicine

## 2019-11-04 ENCOUNTER — Encounter: Payer: Self-pay | Admitting: Family Medicine

## 2019-11-04 ENCOUNTER — Other Ambulatory Visit: Payer: Self-pay

## 2019-11-04 ENCOUNTER — Ambulatory Visit: Payer: BC Managed Care – PPO | Admitting: Family Medicine

## 2019-11-04 VITALS — BP 131/83 | HR 57 | Temp 98.1°F | Ht 67.5 in | Wt 172.0 lb

## 2019-11-04 DIAGNOSIS — E782 Mixed hyperlipidemia: Secondary | ICD-10-CM

## 2019-11-04 DIAGNOSIS — N400 Enlarged prostate without lower urinary tract symptoms: Secondary | ICD-10-CM

## 2019-11-04 DIAGNOSIS — N401 Enlarged prostate with lower urinary tract symptoms: Secondary | ICD-10-CM | POA: Diagnosis not present

## 2019-11-04 DIAGNOSIS — Z23 Encounter for immunization: Secondary | ICD-10-CM

## 2019-11-04 DIAGNOSIS — R3912 Poor urinary stream: Secondary | ICD-10-CM

## 2019-11-04 DIAGNOSIS — S41101A Unspecified open wound of right upper arm, initial encounter: Secondary | ICD-10-CM | POA: Diagnosis not present

## 2019-11-04 DIAGNOSIS — I10 Essential (primary) hypertension: Secondary | ICD-10-CM | POA: Diagnosis not present

## 2019-11-04 DIAGNOSIS — Z0000 Encounter for general adult medical examination without abnormal findings: Secondary | ICD-10-CM | POA: Diagnosis not present

## 2019-11-04 DIAGNOSIS — D692 Other nonthrombocytopenic purpura: Secondary | ICD-10-CM

## 2019-11-04 DIAGNOSIS — I2583 Coronary atherosclerosis due to lipid rich plaque: Secondary | ICD-10-CM

## 2019-11-04 DIAGNOSIS — R7301 Impaired fasting glucose: Secondary | ICD-10-CM

## 2019-11-04 DIAGNOSIS — I2510 Atherosclerotic heart disease of native coronary artery without angina pectoris: Secondary | ICD-10-CM

## 2019-11-04 MED ORDER — CLOPIDOGREL BISULFATE 75 MG PO TABS: 75.0000 mg | ORAL_TABLET | Freq: Every day | ORAL | 1 refills | Status: DC

## 2019-11-04 MED ORDER — SIMVASTATIN 40 MG PO TABS: 40.0000 mg | ORAL_TABLET | Freq: Every day | ORAL | 1 refills | Status: DC

## 2019-11-04 MED ORDER — ENALAPRIL MALEATE 10 MG PO TABS: 10.0000 mg | ORAL_TABLET | Freq: Every day | ORAL | 1 refills | Status: DC

## 2019-11-04 MED ORDER — TAMSULOSIN HCL 0.4 MG PO CAPS
0.4000 mg | ORAL_CAPSULE | Freq: Every day | ORAL | 1 refills | Status: DC
Start: 2019-11-04 — End: 2020-08-09

## 2019-11-04 MED ORDER — HYDROCHLOROTHIAZIDE 12.5 MG PO TABS: 12.5000 mg | ORAL_TABLET | Freq: Every day | ORAL | 1 refills | Status: DC

## 2019-11-04 MED ORDER — FINASTERIDE 5 MG PO TABS: 5.0000 mg | ORAL_TABLET | Freq: Every day | ORAL | 1 refills | Status: DC

## 2019-11-04 MED ORDER — METOPROLOL TARTRATE 25 MG PO TABS: 25.0000 mg | ORAL_TABLET | Freq: Every day | ORAL | 1 refills | Status: DC

## 2019-11-04 NOTE — Patient Instructions (Signed)

## 2019-11-04 NOTE — Assessment & Plan Note (Signed)
Under good control on current regimen. Continue current regimen. Continue to monitor. Call with any concerns. Refills given. Labs drawn today.   

## 2019-11-04 NOTE — Progress Notes (Signed)
BP 131/83   Pulse (!) 57   Temp 98.1 F (36.7 C) (Oral)   Ht 5' 7.5" (1.715 m)   Wt 172 lb (78 kg)   SpO2 98%   BMI 26.54 kg/m    Subjective:    Patient ID: Robert Zuniga, male    DOB: 11/21/1955, 64 y.o.   MRN: [id number]  HPI: Robert Zuniga is a 64 y.o. male presenting on 11/04/2019 for comprehensive medical examination. Current medical complaints include:  Has a bruise on his R thigh that he's not sure where is came from starting on Saturday. It's been hurting. It's been getting better, but he was out of work for 4 days.  Impaired Fasting Glucose HbA1C:  Lab Results  Component Value Date   HGBA1C 6.1 04/27/2019   Duration of elevated blood sugar: chronic Polydipsia: no Polyuria: no Weight change: yes Visual disturbance: no Glucose Monitoring: no Diabetic Education: Not Completed Family history of diabetes: yes  HYPERTENSION / HYPERLIPIDEMIA Satisfied with current treatment? yes Duration of hypertension: chronic BP monitoring frequency: not checking BP medication side effects: no Past BP meds: metoprolol, HCTZ, enalapril Duration of hyperlipidemia: chronic Cholesterol medication side effects: yes Cholesterol supplements: none Past cholesterol medications: simvastatin Medication compliance: excellent compliance Aspirin: yes Recent stressors: no Recurrent headaches: no Visual changes: no Palpitations: no Dyspnea: no Chest pain: no Lower extremity edema: no Dizzy/lightheaded: no  BPH BPH status: controlled Satisfied with current treatment?: yes Medication side effects: no Medication compliance: excellent compliance Duration: chronic Nocturia: 1-2x per night Urinary frequency:no Incomplete voiding: no Urgency: no Weak urinary stream: no Straining to start stream: no Dysuria: no Onset: gradual Severity: moderate  He currently lives with: wife Interim Problems from his last visit: no  Depression Screen done today and results listed below:    Depression screen [facility] 2/9 11/04/2019 11/04/2019 10/27/2018 06/17/2017 05/05/2016  Decreased Interest 0 0 0 0 0  Down, Depressed, Hopeless 0 0 0 0 0  PHQ - 2 Score 0 0 0 0 0  Altered sleeping - 0 0 - -  Tired, decreased energy - 0 0 - -  Change in appetite - 0 0 - -  Feeling bad or failure about yourself  - 0 0 - -  Trouble concentrating - 0 0 - -  Moving slowly or fidgety/restless - 0 0 - -  Suicidal thoughts - 0 0 - -  PHQ-9 Score - 0 0 - -  Difficult doing work/chores - Not difficult at all - - -    Past Medical History:  Past Medical History:  Diagnosis Date  . Bladder hemorrhage 2015  . CAD (coronary artery disease)   . Hyperlipidemia   . Hypertension   . [place]    X3  . Psoriasis     Surgical History:  Past Surgical History:  Procedure Laterality Date  . CORONARY ANGIOPLASTY WITH STENT PLACEMENT    . CYSTOSTOMY W/ BLADDER BIOPSY    . JOINT REPLACEMENT Bilateral    hips  . PROSTATE SURGERY  2015   polyps -resolved    Medications:  Current Outpatient Medications on File Prior to Visit  Medication Sig  . aspirin EC 81 MG tablet Take 81 mg by mouth daily.  . betamethasone dipropionate 0.05 % cream APPLY TO AFFECTED AREA(S) TWO TIMES A DAY AS NEEDED  . betamethasone dipropionate 0.05 % lotion APPLY TO AFFECTED AREA(S) AS DIRECTED. DO NOT USE FOR MORE THAN 2 WEEKS IN THE SAME AREA  . calcium &  magnesium carbonates (MYLANTA) 381-017 MG tablet Take 1 tablet by mouth daily.  Marland Kitchen ELMIRON 100 MG capsule   . ketoconazole (NIZORAL) 2 % shampoo USE 1 APPLICATION TOPICALLY TWICE A WEEK.  . naproxen sodium (ANAPROX) 220 MG tablet Take 220 mg by mouth as needed.  . phenazopyridine (PYRIDIUM) 200 MG tablet Take 1 tablet (200 mg total) by mouth 3 (three) times daily as needed for pain.   No current facility-administered medications on file prior to visit.    Allergies:  Allergies  Allergen Reactions  . Morphine Nausea Only and Nausea And Vomiting  .  Haemophilus Influenzae Other (See Comments)    Joint pain Joint pain  . Influenza Vaccine Recombinant Other (See Comments)    Joint pain  . Levaquin [Levofloxacin In D5w] Nausea And Vomiting  . Niaspan [Niacin Er] Hives  . Pneumovax [Pneumococcal Polysaccharide Vaccine] Other (See Comments)    Joint pain    Social History:  Social History   Socioeconomic History  . Marital status: Married    Spouse name: Not on file  . Number of children: Not on file  . Years of education: Not on file  . Highest education level: Not on file  Occupational History  . Not on file  Tobacco Use  . Smoking status: Former Smoker    Packs/day: 1.00    Years: 2.00    Pack years: 2.00    Types: Cigarettes    Quit date: 09/12/1987    Years since quitting: 32.1  . Smokeless tobacco: Never Used  Substance and Sexual Activity  . Alcohol use: No    Alcohol/week: 0.0 standard drinks  . Drug use: No  . Sexual activity: Not on file  Other Topics Concern  . Not on file  Social History Narrative  . Not on file   Social Determinants of Health   Financial Resource Strain:   . Difficulty of Paying Living Expenses: Not on file  Food Insecurity:   . Worried About Charity fundraiser in the Last Year: Not on file  . Ran Out of Food in the Last Year: Not on file  Transportation Needs:   . Lack of Transportation (Medical): Not on file  . Lack of Transportation (Non-Medical): Not on file  Physical Activity:   . Days of Exercise per Week: Not on file  . Minutes of Exercise per Session: Not on file  Stress:   . Feeling of Stress : Not on file  Social Connections:   . Frequency of Communication with Friends and Family: Not on file  . Frequency of Social Gatherings with Friends and Family: Not on file  . Attends Religious Services: Not on file  . Active Member of Clubs or Organizations: Not on file  . Attends Archivist Meetings: Not on file  . Marital Status: Not on file  Intimate Partner  Violence:   . Fear of Current or Ex-Partner: Not on file  . Emotionally Abused: Not on file  . Physically Abused: Not on file  . Sexually Abused: Not on file   Social History   Tobacco Use  Smoking Status Former Smoker  . Packs/day: 1.00  . Years: 2.00  . Pack years: 2.00  . Types: Cigarettes  . Quit date: 09/12/1987  . Years since quitting: 32.1  Smokeless Tobacco Never Used   Social History   Substance and Sexual Activity  Alcohol Use No  . Alcohol/week: 0.0 standard drinks    Family History:  Family History  Problem  Relation Age of Onset  . Diabetes Mother   . Dementia Mother   . Heart disease Father   . Cancer Sister   . Liver disease Sister   . Heart disease Maternal Grandfather   . Heart disease Paternal Grandfather     Past medical history, surgical history, medications, allergies, family history and social history reviewed with patient today and changes made to appropriate areas of the chart.   Review of Systems  Constitutional: Negative.   HENT: Negative.   Eyes: Negative.   Respiratory: Negative.   Cardiovascular: Negative.   Gastrointestinal: Negative.   Genitourinary: Positive for dysuria. Negative for flank pain, frequency, hematuria and urgency.  Musculoskeletal: Positive for myalgias. Negative for back pain, falls, joint pain and neck pain.  Skin: Negative.   Neurological: Negative.   Endo/Heme/Allergies: Negative for environmental allergies and polydipsia. Bruises/bleeds easily.  Psychiatric/Behavioral: Negative.     All other ROS negative except what is listed above and in the HPI.      Objective:    BP 131/83   Pulse (!) 57   Temp 98.1 F (36.7 C) (Oral)   Ht 5' 7.5" (1.715 m)   Wt 172 lb (78 kg)   SpO2 98%   BMI 26.54 kg/m   Wt Readings from Last 3 Encounters:  11/04/19 172 lb (78 kg)  04/27/19 180 lb (81.6 kg)  10/27/18 185 lb (83.9 kg)    Physical Exam Vitals and nursing note reviewed.  Constitutional:      General: He is  not in acute distress.    Appearance: Normal appearance. He is normal weight. He is not ill-appearing, toxic-appearing or diaphoretic.  HENT:     Head: Normocephalic and atraumatic.     Right Ear: Tympanic membrane, ear canal and external ear normal. There is no impacted cerumen.     Left Ear: Tympanic membrane, ear canal and external ear normal. There is no impacted cerumen.     Nose: Nose normal. No congestion or rhinorrhea.     Mouth/Throat:     Mouth: Mucous membranes are moist.     Pharynx: Oropharynx is clear. No oropharyngeal exudate or posterior oropharyngeal erythema.  Eyes:     General: No scleral icterus.       Right eye: No discharge.        Left eye: No discharge.     Extraocular Movements: Extraocular movements intact.     Conjunctiva/sclera: Conjunctivae normal.     Pupils: Pupils are equal, round, and reactive to light.  Neck:     Vascular: No carotid bruit.  Cardiovascular:     Rate and Rhythm: Normal rate and regular rhythm.     Pulses: Normal pulses.     Heart sounds: No murmur heard.  No friction rub. No gallop.   Pulmonary:     Effort: Pulmonary effort is normal. No respiratory distress.     Breath sounds: Normal breath sounds. No stridor. No wheezing, rhonchi or rales.  Chest:     Chest wall: No tenderness.  Abdominal:     General: Abdomen is flat. Bowel sounds are normal. There is no distension.     Palpations: Abdomen is soft. There is no mass.     Tenderness: There is no abdominal tenderness. There is no right CVA tenderness, left CVA tenderness, guarding or rebound.     Hernia: No hernia is present.  Genitourinary:    Comments: Genital exam deferred with shared decision making Musculoskeletal:  General: No swelling, tenderness, deformity or signs of injury.     Cervical back: Normal range of motion and neck supple. No rigidity. No muscular tenderness.     Right lower leg: No edema.     Left lower leg: No edema.  Lymphadenopathy:     Cervical:  No cervical adenopathy.  Skin:    General: Skin is warm and dry.     Capillary Refill: Capillary refill takes less than 2 seconds.     Coloration: Skin is not jaundiced or pale.     Findings: Bruising present. No erythema, lesion or rash.     Comments: Small well healing wound on R arm  Neurological:     General: No focal deficit present.     Mental Status: He is alert and oriented to person, place, and time.     Cranial Nerves: No cranial nerve deficit.     Sensory: No sensory deficit.     Motor: No weakness.     Coordination: Coordination normal.     Gait: Gait normal.     Deep Tendon Reflexes: Reflexes normal.  Psychiatric:        Mood and Affect: Mood normal.        Behavior: Behavior normal.        Thought Content: Thought content normal.        Judgment: Judgment normal.     Results for orders placed or performed in visit on 04/27/19  Microscopic Examination   URINE  Result Value Ref Range   WBC, UA 0-5 0 - 5 /hpf   RBC 3-10 (A) 0 - 2 /hpf   Epithelial Cells (non renal) 0-10 0 - 10 /hpf   Bacteria, UA None seen None seen/Few  Urine Culture, Reflex   URINE  Result Value Ref Range   Urine Culture, Routine WILL FOLLOW   Comprehensive metabolic panel  Result Value Ref Range   Glucose 119 (H) 65 - 99 mg/dL   BUN 12 8 - 27 mg/dL   Creatinine, Ser 0.79 0.76 - 1.27 mg/dL   GFR calc non Af Amer 94 >59 mL/min/1.73   GFR calc Af Amer 109 >59 mL/min/1.73   BUN/Creatinine Ratio 15 10 - 24   Sodium 141 134 - 144 mmol/L   Potassium 4.1 3.5 - 5.2 mmol/L   Chloride 108 (H) 96 - 106 mmol/L   CO2 22 20 - 29 mmol/L   Calcium 8.8 8.6 - 10.2 mg/dL   Total Protein 6.0 6.0 - 8.5 g/dL   Albumin 4.2 3.8 - 4.8 g/dL   Globulin, Total 1.8 1.5 - 4.5 g/dL   Albumin/Globulin Ratio 2.3 (H) 1.2 - 2.2   Bilirubin Total 0.5 0.0 - 1.2 mg/dL   Alkaline Phosphatase 120 (H) 39 - 117 IU/L   AST 22 0 - 40 IU/L   ALT 17 0 - 44 IU/L  Lipid Panel w/o Chol/HDL Ratio  Result Value Ref Range    Cholesterol, Total 130 100 - 199 mg/dL   Triglycerides 99 0 - 149 mg/dL   HDL 43 >39 mg/dL   VLDL Cholesterol Cal 19 5 - 40 mg/dL   LDL Chol Calc (NIH) 68 0 - 99 mg/dL  UA/M w/rflx Culture, Routine   Specimen: Urine   URINE  Result Value Ref Range   Specific Gravity, UA 1.025 1.005 - 1.030   pH, UA 6.0 5.0 - 7.5   Color, UA Yellow Yellow   Appearance Ur Clear Clear   Leukocytes,UA Negative Negative  Protein,UA 1+ (A) Negative/Trace   Glucose, UA 2+ (A) Negative   Ketones, UA Negative Negative   RBC, UA Trace (A) Negative   Bilirubin, UA Negative Negative   Urobilinogen, Ur >8.0 (H) 0.2 - 1.0 mg/dL   Nitrite, UA Negative Negative   Microscopic Examination See below:   Bayer DCA Hb A1c Waived  Result Value Ref Range   HB A1C (BAYER DCA - WAIVED) 6.1 <7.0 %      Assessment & Plan:   Problem List Items Addressed This Visit      Cardiovascular and Mediastinum   Hypertension    Under good control on current regimen. Continue current regimen. Continue to monitor. Call with any concerns. Refills given. Labs drawn today.       Relevant Medications   simvastatin (ZOCOR) 40 MG tablet   metoprolol tartrate (LOPRESSOR) 25 MG tablet   hydrochlorothiazide (HYDRODIURIL) 12.5 MG tablet   enalapril (VASOTEC) 10 MG tablet   Other Relevant Orders   CBC with Differential/Platelet   Comprehensive metabolic panel   Microalbumin, Urine Waived   TSH   CAD (coronary artery disease)   Relevant Medications   simvastatin (ZOCOR) 40 MG tablet   metoprolol tartrate (LOPRESSOR) 25 MG tablet   hydrochlorothiazide (HYDRODIURIL) 12.5 MG tablet   enalapril (VASOTEC) 10 MG tablet   clopidogrel (PLAVIX) 75 MG tablet   Purpura senilis (HCC)    Reassured patient. Continue to monitor. Call with any concerns.       Relevant Medications   simvastatin (ZOCOR) 40 MG tablet   metoprolol tartrate (LOPRESSOR) 25 MG tablet   hydrochlorothiazide (HYDRODIURIL) 12.5 MG tablet   enalapril (VASOTEC) 10 MG  tablet     Endocrine   IFG (impaired fasting glucose)    Doing great with A1c of 5.5. Continue diet and exercise. Call with any concerns. Continue to monitor.       Relevant Orders   Bayer DCA Hb A1c Waived   CBC with Differential/Platelet   Comprehensive metabolic panel   Microalbumin, Urine Waived   Urinalysis, Routine w reflex microscopic     Other   Hyperlipidemia    Under good control on current regimen. Continue current regimen. Continue to monitor. Call with any concerns. Refills given. Labs drawn today.      Relevant Medications   simvastatin (ZOCOR) 40 MG tablet   metoprolol tartrate (LOPRESSOR) 25 MG tablet   hydrochlorothiazide (HYDRODIURIL) 12.5 MG tablet   enalapril (VASOTEC) 10 MG tablet   Other Relevant Orders   CBC with Differential/Platelet   Comprehensive metabolic panel   Lipid Panel w/o Chol/HDL Ratio    Other Visit Diagnoses    Routine general medical examination at a health care facility    -  Primary   Vaccines up to date/declined. Screening labs checked today. Cologuard ordered. Continue diet and exercise. Call with any concerns.    Benign prostatic hyperplasia with weak urinary stream       Under good control on current regimen. Continue current regimen. Continue to monitor. Call with any concerns. Refills given. Labs drawn today.   Relevant Orders   PSA   Urinalysis, Routine w reflex microscopic   Open wound of right upper arm, initial encounter       Healing well. Due for Td. Given today.   Relevant Orders   Td : Tetanus/diphtheria >7yo Preservative  free (Completed)   Essential hypertension       Relevant Medications   simvastatin (ZOCOR) 40 MG tablet   metoprolol tartrate (  LOPRESSOR) 25 MG tablet   hydrochlorothiazide (HYDRODIURIL) 12.5 MG tablet   enalapril (VASOTEC) 10 MG tablet   Benign prostatic hyperplasia without lower urinary tract symptoms       Relevant Medications   tamsulosin (FLOMAX) 0.4 MG CAPS capsule   finasteride (PROSCAR)  5 MG tablet       Discussed aspirin prophylaxis for myocardial infarction prevention and decision was made to continue ASA  LABORATORY TESTING:  Health maintenance labs ordered today as discussed above.   The natural history of prostate cancer and ongoing controversy regarding screening and potential treatment outcomes of prostate cancer has been discussed with the patient. The meaning of a false positive PSA and a false negative PSA has been discussed. He indicates understanding of the limitations of this screening test and wishes to proceed with screening PSA testing.   IMMUNIZATIONS:   - Tdap: Tetanus vaccination status reviewed: Td vaccination indicated and given today. - Influenza: N/A due to allergies - Pneumovax: N/A due to allergies - Prevnar: N/A due to allergies - COVID: N/A due to allergies  SCREENING: - Colonoscopy: Ordered today  Discussed with patient purpose of the colonoscopy is to detect colon cancer at curable precancerous or early stages   PATIENT COUNSELING:    Sexuality: Discussed sexually transmitted diseases, partner selection, use of condoms, avoidance of unintended pregnancy  and contraceptive alternatives.   Advised to avoid cigarette smoking.  I discussed with the patient that most people either abstain from alcohol or drink within safe limits (<=14/week and <=4 drinks/occasion for males, <=7/weeks and <= 3 drinks/occasion for females) and that the risk for alcohol disorders and other health effects rises proportionally with the number of drinks per week and how often a drinker exceeds daily limits.  Discussed cessation/primary prevention of drug use and availability of treatment for abuse.   Diet: Encouraged to adjust caloric intake to maintain  or achieve ideal body weight, to reduce intake of dietary saturated fat and total fat, to limit sodium intake by avoiding high sodium foods and not adding table salt, and to maintain adequate dietary potassium and  calcium preferably from fresh fruits, vegetables, and low-fat dairy products.    stressed the importance of regular exercise  Injury prevention: Discussed safety belts, safety helmets, smoke detector, smoking near bedding or upholstery.   Dental health: Discussed importance of regular tooth brushing, flossing, and dental visits.   Follow up plan: NEXT PREVENTATIVE PHYSICAL DUE IN 1 YEAR. Return in about 6 months (around 05/04/2020).

## 2019-11-04 NOTE — Assessment & Plan Note (Signed)
Reassured patient. Continue to monitor. Call with any concerns.  

## 2019-11-04 NOTE — Assessment & Plan Note (Signed)
Doing great with A1c of 5.5. Continue diet and exercise. Call with any concerns. Continue to monitor.

## 2019-11-05 LAB — COMPREHENSIVE METABOLIC PANEL
ALT: 19 IU/L (ref 0–44)
AST: 22 IU/L (ref 0–40)
Albumin/Globulin Ratio: 2.1 (ref 1.2–2.2)
Albumin: 4.5 g/dL (ref 3.8–4.8)
Alkaline Phosphatase: 124 IU/L — ABNORMAL HIGH (ref 44–121)
BUN/Creatinine Ratio: 15 (ref 10–24)
BUN: 12 mg/dL (ref 8–27)
Bilirubin Total: 1 mg/dL (ref 0.0–1.2)
CO2: 27 mmol/L (ref 20–29)
Calcium: 10.1 mg/dL (ref 8.6–10.2)
Chloride: 104 mmol/L (ref 96–106)
Creatinine, Ser: 0.82 mg/dL (ref 0.76–1.27)
GFR calc Af Amer: 107 mL/min/{1.73_m2} (ref >59)
GFR calc non Af Amer: 93 mL/min/{1.73_m2} (ref >59)
Globulin, Total: 2.1 g/dL (ref 1.5–4.5)
Glucose: 95 mg/dL (ref 65–99)
Potassium: 4.8 mmol/L (ref 3.5–5.2)
Sodium: 143 mmol/L (ref 134–144)
Total Protein: 6.6 g/dL (ref 6.0–8.5)

## 2019-11-05 LAB — CBC WITH DIFFERENTIAL/PLATELET
Basophils Absolute: 0.1 10*3/uL (ref 0.0–0.2)
Basos: 1 %
EOS (ABSOLUTE): 0.1 10*3/uL (ref 0.0–0.4)
Eos: 2 %
Hematocrit: 47.1 % (ref 37.5–51.0)
Hemoglobin: 16.4 g/dL (ref 13.0–17.7)
Immature Grans (Abs): 0 10*3/uL (ref 0.0–0.1)
Immature Granulocytes: 0 %
Lymphocytes Absolute: 1.2 10*3/uL (ref 0.7–3.1)
Lymphs: 20 %
MCH: 29.9 pg (ref 26.6–33.0)
MCHC: 34.8 g/dL (ref 31.5–35.7)
MCV: 86 fL (ref 79–97)
Monocytes Absolute: 0.5 10*3/uL (ref 0.1–0.9)
Monocytes: 9 %
Neutrophils Absolute: 4 10*3/uL (ref 1.4–7.0)
Neutrophils: 68 %
Platelets: 183 10*3/uL (ref 150–450)
RBC: 5.48 x10E6/uL (ref 4.14–5.80)
RDW: 13 % (ref 11.6–15.4)
WBC: 5.9 10*3/uL (ref 3.4–10.8)

## 2019-11-05 LAB — MICROSCOPIC EXAMINATION: Bacteria, UA: NONE SEEN

## 2019-11-05 LAB — URINALYSIS, ROUTINE W REFLEX MICROSCOPIC
Bilirubin, UA: NEGATIVE
Ketones, UA: NEGATIVE
Leukocytes,UA: NEGATIVE
Nitrite, UA: NEGATIVE
Protein,UA: NEGATIVE
Specific Gravity, UA: 1.025 (ref 1.005–1.030)
Urobilinogen, Ur: 1 mg/dL (ref 0.2–1.0)
pH, UA: 5 (ref 5.0–7.5)

## 2019-11-05 LAB — LIPID PANEL W/O CHOL/HDL RATIO
Cholesterol, Total: 140 mg/dL (ref 100–199)
HDL: 46 mg/dL (ref >39)
LDL Chol Calc (NIH): 75 mg/dL (ref 0–99)
Triglycerides: 104 mg/dL (ref 0–149)
VLDL Cholesterol Cal: 19 mg/dL (ref 5–40)

## 2019-11-05 LAB — MICROALBUMIN, URINE WAIVED
Creatinine, Urine Waived: 200 mg/dL (ref 10–300)
Microalb, Ur Waived: 30 mg/L — ABNORMAL HIGH (ref 0–19)
Microalb/Creat Ratio: <30 mg/g (ref <30)

## 2019-11-05 LAB — PSA: Prostate Specific Ag, Serum: 1 ng/mL (ref 0.0–4.0)

## 2019-11-05 LAB — BAYER DCA HB A1C WAIVED: HB A1C (BAYER DCA - WAIVED): 5.3 % (ref <7.0)

## 2019-11-05 LAB — TSH: TSH: 0.825 u[IU]/mL (ref 0.450–4.500)

## 2019-11-07 ENCOUNTER — Encounter: Payer: Self-pay | Admitting: Family Medicine

## 2019-12-14 ENCOUNTER — Other Ambulatory Visit: Payer: Self-pay

## 2019-12-14 NOTE — Telephone Encounter (Signed)
Refill received for Betamethasone 0.05% lotion  Last OV 11/04/19 No future appt.

## 2019-12-15 MED ORDER — BETAMETHASONE DIPROPIONATE 0.05 % EX LOTN
TOPICAL_LOTION | CUTANEOUS | 4 refills | Status: DC
Start: 2019-12-15 — End: 2021-02-06

## 2020-01-19 ENCOUNTER — Emergency Department
Admission: EM | Admit: 2020-01-19 | Discharge: 2020-01-19 | Disposition: A | Discharge status: Home / Self Care | Payer: BC Managed Care – PPO | Attending: Emergency Medicine | Admitting: Emergency Medicine

## 2020-01-19 ENCOUNTER — Other Ambulatory Visit: Payer: Self-pay | Admitting: Family Medicine

## 2020-01-19 ENCOUNTER — Other Ambulatory Visit: Payer: Self-pay

## 2020-01-19 ENCOUNTER — Encounter: Payer: Self-pay | Admitting: Emergency Medicine

## 2020-01-19 ENCOUNTER — Emergency Department: Payer: BC Managed Care – PPO

## 2020-01-19 DIAGNOSIS — R0789 Other chest pain: Secondary | ICD-10-CM | POA: Diagnosis not present

## 2020-01-19 DIAGNOSIS — Z79899 Other long term (current) drug therapy: Secondary | ICD-10-CM | POA: Diagnosis not present

## 2020-01-19 DIAGNOSIS — I10 Essential (primary) hypertension: Secondary | ICD-10-CM | POA: Insufficient documentation

## 2020-01-19 DIAGNOSIS — R0602 Shortness of breath: Secondary | ICD-10-CM | POA: Diagnosis not present

## 2020-01-19 DIAGNOSIS — Z87891 Personal history of nicotine dependence: Secondary | ICD-10-CM | POA: Insufficient documentation

## 2020-01-19 DIAGNOSIS — I2510 Atherosclerotic heart disease of native coronary artery without angina pectoris: Secondary | ICD-10-CM | POA: Insufficient documentation

## 2020-01-19 DIAGNOSIS — R079 Chest pain, unspecified: Secondary | ICD-10-CM | POA: Diagnosis not present

## 2020-01-19 DIAGNOSIS — Z7982 Long term (current) use of aspirin: Secondary | ICD-10-CM | POA: Diagnosis not present

## 2020-01-19 DIAGNOSIS — R069 Unspecified abnormalities of breathing: Secondary | ICD-10-CM | POA: Diagnosis not present

## 2020-01-19 DIAGNOSIS — Z955 Presence of coronary angioplasty implant and graft: Secondary | ICD-10-CM | POA: Diagnosis not present

## 2020-01-19 DIAGNOSIS — U071 COVID-19: Secondary | ICD-10-CM | POA: Diagnosis not present

## 2020-01-19 LAB — BASIC METABOLIC PANEL
Anion gap: 11 (ref 5–15)
BUN: 17 mg/dL (ref 8–23)
CO2: 29 mmol/L (ref 22–32)
Calcium: 9 mg/dL (ref 8.9–10.3)
Chloride: 97 mmol/L — ABNORMAL LOW (ref 98–111)
Creatinine, Ser: 0.77 mg/dL (ref 0.61–1.24)
GFR, Estimated: >60 mL/min (ref >60)
Glucose, Bld: 101 mg/dL — ABNORMAL HIGH (ref 70–99)
Potassium: 3.7 mmol/L (ref 3.5–5.1)
Sodium: 137 mmol/L (ref 135–145)

## 2020-01-19 LAB — TROPONIN I (HIGH SENSITIVITY)
Troponin I (High Sensitivity): 7 ng/L (ref <18)
Troponin I (High Sensitivity): 6 ng/L (ref <18)

## 2020-01-19 LAB — CBC
HCT: 46.4 % (ref 39.0–52.0)
Hemoglobin: 16.3 g/dL (ref 13.0–17.0)
MCH: 29.4 pg (ref 26.0–34.0)
MCHC: 35.1 g/dL (ref 30.0–36.0)
MCV: 83.8 fL (ref 80.0–100.0)
Platelets: 143 10*3/uL — ABNORMAL LOW (ref 150–400)
RBC: 5.54 MIL/uL (ref 4.22–5.81)
RDW: 12.2 % (ref 11.5–15.5)
WBC: 3.1 10*3/uL — ABNORMAL LOW (ref 4.0–10.5)
nRBC: 0 % (ref 0.0–0.2)

## 2020-01-19 MED ORDER — POTASSIUM CHLORIDE CRYS ER 20 MEQ PO TBCR
40.0000 meq | EXTENDED_RELEASE_TABLET | Freq: Once | ORAL | Status: AC
  Administered 2020-01-19: 40 meq via ORAL
  Filled 2020-01-19: qty 2

## 2020-01-19 MED ORDER — ONDANSETRON HCL 4 MG PO TABS: 4.0000 mg | ORAL_TABLET | Freq: Three times a day (TID) | ORAL | 0 refills | Status: DC | PRN

## 2020-01-19 NOTE — ED Triage Notes (Signed)
Pt to ED via EMS with c/o 5/10 bilateral CP, pt states started earlier today, states Dr. [name] is his cardiologist. Pt denies radiation of pain at this time. Pt states pain relieved with Nitro sprays given by EMS. Pt also c/o nausea x 1 week.

## 2020-01-19 NOTE — ED Provider Notes (Signed)
[facility] Emergency Department Provider Note  ____________________________________________   I have reviewed the triage vital signs and the nursing notes.   HISTORY  Chief Complaint Chest Pain   History limited by: Not Limited   HPI Robert Zuniga is a 65 y.o. male who presents to the emergency department today because of concern for chest pain. The patient states that the pain started when he was at work. He works at a distrubution center and moves packages around. He says that it is not particularly strenuous but there is a lot of movement. He describes a sense of pressure across his chest. The patient says he also had some shortness of breath. Over the past few days he has been having nausea, decreased appetite and change in taste. EMS gave patient nitroglycerin and the patient did feel better afterwards. Patient does have history of heart attacks in the past but this did not remind him of the pain he has had with that.    Records reviewed. Per medical record review patient has a history of CAD, HLD, HTN.  Past Medical History:  Diagnosis Date  . Bladder hemorrhage 2015  . CAD (coronary artery disease)   . Hyperlipidemia   . Hypertension   . MI (myocardial infarction) (HCC)    X3  . Psoriasis     Patient Active Problem List   Diagnosis Date Noted  . IFG (impaired fasting glucose) 04/29/2019  . S/P TURP 11/06/2017  . S/P coronary artery stent placement 09/24/2017  . Purpura senilis (HCC) 06/17/2017  . Hyperlipidemia   . Hypertension   . CAD (coronary artery disease)   . Psoriasis     Past Surgical History:  Procedure Laterality Date  . CORONARY ANGIOPLASTY WITH STENT PLACEMENT    . CYSTOSTOMY W/ BLADDER BIOPSY    . JOINT REPLACEMENT Bilateral    hips  . PROSTATE SURGERY  2015   polyps -resolved    Prior to Admission medications   Medication Sig Start Date End Date Taking? Authorizing Provider  aspirin EC 81 MG tablet Take 81 mg by  mouth daily.    [provider]  betamethasone dipropionate 0.05 % cream APPLY TO AFFECTED AREA(S) TWO TIMES A DAY AS NEEDED 06/21/19   Particia Nearing, PA-C  betamethasone dipropionate 0.05 % lotion APPLY TO AFFECTED AREA(S) AS DIRECTED. DO NOT USE FOR MORE THAN 2 WEEKS IN THE SAME AREA 12/15/19   Olevia Perches P, DO  calcium & magnesium carbonates (MYLANTA) 311-232 MG tablet Take 1 tablet by mouth daily.    [provider]  clopidogrel (PLAVIX) 75 MG tablet Take 1 tablet (75 mg total) by mouth daily. 11/04/19   Dorcas Carrow, DO  ELMIRON 100 MG capsule  11/10/17   [provider]  enalapril (VASOTEC) 10 MG tablet Take 1 tablet (10 mg total) by mouth daily. 11/04/19   Johnson, Megan P, DO  finasteride (PROSCAR) 5 MG tablet Take 1 tablet (5 mg total) by mouth at bedtime. 11/04/19   Johnson, Megan P, DO  hydrochlorothiazide (HYDRODIURIL) 12.5 MG tablet Take 1 tablet (12.5 mg total) by mouth daily. 11/04/19   Johnson, Megan P, DO  ketoconazole (NIZORAL) 2 % shampoo USE 1 APPLICATION TOPICALLY TWICE A WEEK. 06/12/19   Margaretann Loveless, PA-C  metoprolol tartrate (LOPRESSOR) 25 MG tablet Take 1 tablet (25 mg total) by mouth daily. 11/04/19   Johnson, Megan P, DO  naproxen sodium (ANAPROX) 220 MG tablet Take 220 mg by mouth as needed.  [provider]  phenazopyridine (PYRIDIUM) 200 MG tablet Take 1 tablet (200 mg total) by mouth 3 (three) times daily as needed for pain. 04/27/19   Volney American, PA-C  simvastatin (ZOCOR) 40 MG tablet Take 1 tablet (40 mg total) by mouth daily. 11/04/19   Johnson, Megan P, DO  tamsulosin (FLOMAX) 0.4 MG CAPS capsule Take 1 capsule (0.4 mg total) by mouth daily. 11/04/19   Park Liter P, DO    Allergies Morphine, Haemophilus influenzae, Influenza vaccine recombinant, Levaquin [levofloxacin in d5w], Niaspan [niacin er], and Pneumovax [pneumococcal polysaccharide vaccine]  Family History  Problem Relation Age of  Onset  . Diabetes Mother   . Dementia Mother   . Heart disease Father   . Cancer Sister   . Liver disease Sister   . Heart disease Maternal Grandfather   . Heart disease Paternal Grandfather     Social History Social History   Tobacco Use  . Smoking status: Former Smoker    Packs/day: 1.00    Years: 2.00    Pack years: 2.00    Types: Cigarettes    Quit date: 09/12/1987    Years since quitting: 32.3  . Smokeless tobacco: Never Used  Substance Use Topics  . Alcohol use: No    Alcohol/week: 0.0 standard drinks  . Drug use: No    Review of Systems Constitutional: No fever/chills Eyes: No visual changes. ENT: No sore throat. Cardiovascular: Positive for chest pain. Respiratory: Positive for shortness of breath. Gastrointestinal: No abdominal pain.  Positive for nausea and decreased appetite.  Genitourinary: Negative for dysuria. Musculoskeletal: Negative for back pain. Skin: Negative for rash. Neurological: Negative for headaches, focal weakness or numbness.  ____________________________________________   PHYSICAL EXAM:  VITAL SIGNS: ED Triage Vitals [01/19/20 1119]  Enc Vitals Group     BP 121/86     Pulse Rate 74     Resp 20     Temp 99 F (37.2 C)     Temp Source Oral     SpO2 97 %     Weight 179 lb (81.2 kg)     Height 5\' 8"  (1.727 m)     Head Circumference      Peak Flow      Pain Score 5    Constitutional: Alert and oriented.  Eyes: Conjunctivae are normal.  ENT      Head: Normocephalic and atraumatic.      Nose: No congestion/rhinnorhea.      Mouth/Throat: Mucous membranes are moist.      Neck: No stridor. Hematological/Lymphatic/Immunilogical: No cervical lymphadenopathy. Cardiovascular: Normal rate, regular rhythm.  No murmurs, rubs, or gallops.  Respiratory: Normal respiratory effort without tachypnea nor retractions. Breath sounds are clear and equal bilaterally. No wheezes/rales/rhonchi. Gastrointestinal: Soft and non tender. No rebound.  No guarding.  Genitourinary: Deferred Musculoskeletal: Normal range of motion in all extremities. No lower extremity edema. Neurologic:  Normal speech and language. No gross focal neurologic deficits are appreciated.  Skin:  Skin is warm, dry and intact. No rash noted. Psychiatric: Mood and affect are normal. Speech and behavior are normal. Patient exhibits appropriate insight and judgment.  ____________________________________________    LABS (pertinent positives/negatives)  Trop hs 7 to 6 BMP wnl except cl 97, glu 101 CBC wbc 3.1, hgb 16.3, plt 143 ____________________________________________   EKG  I, Nance Pear, attending physician, personally viewed and interpreted this EKG  EKG Time: 1114 Rate: 75 Rhythm: normal sinus rhythm Axis: normal Intervals: qtc 462 QRS: narrow ST changes:  no st elevation Impression: normal   ____________________________________________    RADIOLOGY  CXR No acute abnormality  ____________________________________________   PROCEDURES  Procedures  ____________________________________________   INITIAL IMPRESSION / ASSESSMENT AND PLAN / ED COURSE  Pertinent labs & imaging results that were available during my care of the patient were reviewed by me and considered in my medical decision making (see chart for details).   Patient came to the emergency department today because of concern for chest pain. At the time of my exam the patient is chest pain free. The patient had negative troponin times 2. Negative CXR and EKG. At this time in terms of cardiac concerns I have low suspicion for ACS. Do think however given his history he would benefit from cardiology follow up and I discussed this with him. I do have concern he might have covid given his other symptoms that have been present for the past few days. Will send covid swab and give prescription for zofran.  ____________________________________________   FINAL CLINICAL  IMPRESSION(S) / ED DIAGNOSES  Final diagnoses:  Nonspecific chest pain     Note: This dictation was prepared with Dragon dictation. Any transcriptional errors that result from this process are unintentional     Nance Pear, MD 01/19/20 [phone]

## 2020-01-19 NOTE — Discharge Instructions (Signed)
Please seek medical attention for any high fevers, chest pain, shortness of breath, change in behavior, persistent vomiting, bloody stool or any other new or concerning symptoms.  

## 2020-01-19 NOTE — ED Triage Notes (Signed)
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 Per EMS Q waves noted inferiorly on EKG, no elevation on EKG.   113/74 after SL nitro HR 70's 95% RA   324 ASA 1 Spray SL nitro 4mg  Zofran  18G R wrist

## 2020-01-20 LAB — SARS CORONAVIRUS 2 (TAT 6-24 HRS): SARS Coronavirus 2: POSITIVE — AB

## 2020-04-12 DIAGNOSIS — R001 Bradycardia, unspecified: Secondary | ICD-10-CM | POA: Diagnosis not present

## 2020-04-12 DIAGNOSIS — I10 Essential (primary) hypertension: Secondary | ICD-10-CM | POA: Diagnosis not present

## 2020-04-12 DIAGNOSIS — I2510 Atherosclerotic heart disease of native coronary artery without angina pectoris: Secondary | ICD-10-CM | POA: Diagnosis not present

## 2020-04-12 DIAGNOSIS — Z955 Presence of coronary angioplasty implant and graft: Secondary | ICD-10-CM | POA: Diagnosis not present

## 2020-08-08 ENCOUNTER — Telehealth: Payer: Self-pay | Admitting: Family Medicine

## 2020-08-08 DIAGNOSIS — N400 Enlarged prostate without lower urinary tract symptoms: Secondary | ICD-10-CM

## 2020-08-08 DIAGNOSIS — I2583 Coronary atherosclerosis due to lipid rich plaque: Secondary | ICD-10-CM

## 2020-08-08 DIAGNOSIS — I2510 Atherosclerotic heart disease of native coronary artery without angina pectoris: Secondary | ICD-10-CM

## 2020-08-08 DIAGNOSIS — I10 Essential (primary) hypertension: Secondary | ICD-10-CM

## 2020-08-08 DIAGNOSIS — E782 Mixed hyperlipidemia: Secondary | ICD-10-CM

## 2020-08-09 ENCOUNTER — Encounter: Payer: Self-pay | Admitting: Nurse Practitioner

## 2020-08-09 ENCOUNTER — Other Ambulatory Visit: Payer: Self-pay

## 2020-08-09 ENCOUNTER — Ambulatory Visit (INDEPENDENT_AMBULATORY_CARE_PROVIDER_SITE_OTHER): Payer: Medicare Other | Admitting: Nurse Practitioner

## 2020-08-09 VITALS — BP 158/90 | HR 64 | Temp 98.4°F | Ht 68.19 in | Wt 175.5 lb

## 2020-08-09 DIAGNOSIS — I2583 Coronary atherosclerosis due to lipid rich plaque: Secondary | ICD-10-CM

## 2020-08-09 DIAGNOSIS — N401 Enlarged prostate with lower urinary tract symptoms: Secondary | ICD-10-CM | POA: Diagnosis not present

## 2020-08-09 DIAGNOSIS — E782 Mixed hyperlipidemia: Secondary | ICD-10-CM

## 2020-08-09 DIAGNOSIS — R7301 Impaired fasting glucose: Secondary | ICD-10-CM

## 2020-08-09 DIAGNOSIS — I10 Essential (primary) hypertension: Secondary | ICD-10-CM

## 2020-08-09 DIAGNOSIS — I2510 Atherosclerotic heart disease of native coronary artery without angina pectoris: Secondary | ICD-10-CM

## 2020-08-09 DIAGNOSIS — R3912 Poor urinary stream: Secondary | ICD-10-CM

## 2020-08-09 LAB — MICROSCOPIC EXAMINATION
Bacteria, UA: NONE SEEN
Epithelial Cells (non renal): NONE SEEN /hpf (ref 0–10)
WBC, UA: NONE SEEN /hpf (ref 0–5)

## 2020-08-09 LAB — URINALYSIS, ROUTINE W REFLEX MICROSCOPIC
Bilirubin, UA: NEGATIVE
Ketones, UA: NEGATIVE
Leukocytes,UA: NEGATIVE
Nitrite, UA: NEGATIVE
Specific Gravity, UA: >1.03 — ABNORMAL HIGH (ref 1.005–1.030)
Urobilinogen, Ur: 1 mg/dL (ref 0.2–1.0)
pH, UA: 5.5 (ref 5.0–7.5)

## 2020-08-09 MED ORDER — ENALAPRIL MALEATE 10 MG PO TABS: 10.0000 mg | ORAL_TABLET | Freq: Every day | ORAL | 3 refills | Status: DC

## 2020-08-09 MED ORDER — HYDROCHLOROTHIAZIDE 12.5 MG PO TABS: 12.5000 mg | ORAL_TABLET | Freq: Every day | ORAL | 3 refills | Status: DC

## 2020-08-09 MED ORDER — CLOPIDOGREL BISULFATE 75 MG PO TABS: 75.0000 mg | ORAL_TABLET | Freq: Every day | ORAL | 3 refills | Status: DC

## 2020-08-09 MED ORDER — TAMSULOSIN HCL 0.4 MG PO CAPS: 0.4000 mg | ORAL_CAPSULE | Freq: Every day | ORAL | 3 refills | Status: DC

## 2020-08-09 MED ORDER — SIMVASTATIN 40 MG PO TABS: 40.0000 mg | ORAL_TABLET | Freq: Every day | ORAL | 3 refills | Status: DC

## 2020-08-09 MED ORDER — METOPROLOL TARTRATE 25 MG PO TABS: 25.0000 mg | ORAL_TABLET | Freq: Every day | ORAL | 3 refills | Status: DC

## 2020-08-09 NOTE — Telephone Encounter (Signed)
Call to [facility])- left message to call office.  Per office visit today- this medication is listed as discontinued-

## 2020-08-09 NOTE — Progress Notes (Signed)
BP (!) 158/90   Pulse 64   Temp 98.4 F (36.9 C)   Ht 5' 8.19" (1.732 m)   Wt 175 lb 8 oz (79.6 kg)   SpO2 98%   BMI 26.54 kg/m    Subjective:    Patient ID: Robert Zuniga, male    DOB: 02-Jan-1956, 65 y.o.   MRN: [id number]  HPI: Robert Zuniga is a 65 y.o. male  Chief Complaint  Patient presents with   Hyperlipidemia   Hypertension   Benign Prostatic Hypertrophy   HYPERTENSION / HYPERLIPIDEMIA Satisfied with current treatment? no Duration of hypertension: years BP monitoring frequency: not checking BP range:  BP medication side effects: no Past BP meds:  Metoprolol and enalapril and HCTZ Duration of hyperlipidemia: years Cholesterol medication side effects: no Cholesterol supplements: none Past cholesterol medications: simvastatin (zocor) Medication compliance: excellent compliance Aspirin: yes Recent stressors: no Recurrent headaches: no Visual changes: no Palpitations: no Dyspnea: no Chest pain: no Lower extremity edema: no Dizzy/lightheaded: no  BPH BPH status: controlled Satisfied with current treatment?: yes Medication side effects: no Medication compliance: excellent compliance Duration: years Nocturia: no Urinary frequency:no Incomplete voiding: no Urgency: no Weak urinary stream: no Straining to start stream: no Dysuria: no Onset: gradual Severity: severe  Relevant past medical, surgical, family and social history reviewed and updated as indicated. Interim medical history since our last visit reviewed. Allergies and medications reviewed and updated.  Review of Systems  Eyes:  Negative for visual disturbance.  Respiratory:  Negative for chest tightness and shortness of breath.   Cardiovascular:  Negative for chest pain, palpitations and leg swelling.  Neurological:  Negative for dizziness, light-headedness, numbness and headaches.   Per HPI unless specifically indicated above     Objective:    BP (!) 158/90   Pulse 64   Temp 98.4  F (36.9 C)   Ht 5' 8.19" (1.732 m)   Wt 175 lb 8 oz (79.6 kg)   SpO2 98%   BMI 26.54 kg/m   Wt Readings from Last 3 Encounters:  08/09/20 175 lb 8 oz (79.6 kg)  01/19/20 179 lb (81.2 kg)  11/04/19 172 lb (78 kg)    Physical Exam Vitals and nursing note reviewed.  Constitutional:      General: He is not in acute distress.    Appearance: Normal appearance. He is not ill-appearing, toxic-appearing or diaphoretic.  HENT:     Head: Normocephalic.     Right Ear: External ear normal.     Left Ear: External ear normal.     Nose: Nose normal. No congestion or rhinorrhea.     Mouth/Throat:     Mouth: Mucous membranes are moist.  Eyes:     General:        Right eye: No discharge.        Left eye: No discharge.     Extraocular Movements: Extraocular movements intact.     Conjunctiva/sclera: Conjunctivae normal.     Pupils: Pupils are equal, round, and reactive to light.  Cardiovascular:     Rate and Rhythm: Normal rate and regular rhythm.     Heart sounds: No murmur heard. Pulmonary:     Effort: Pulmonary effort is normal. No respiratory distress.     Breath sounds: Normal breath sounds. No wheezing, rhonchi or rales.  Abdominal:     General: Abdomen is flat. Bowel sounds are normal.  Musculoskeletal:     Cervical back: Normal range of motion and neck supple.  Skin:  General: Skin is warm and dry.     Capillary Refill: Capillary refill takes less than 2 seconds.  Neurological:     General: No focal deficit present.     Mental Status: He is alert and oriented to person, place, and time.  Psychiatric:        Mood and Affect: Mood normal.        Behavior: Behavior normal.        Thought Content: Thought content normal.        Judgment: Judgment normal.    Results for orders placed or performed during the hospital encounter of 01/19/20  SARS CORONAVIRUS 2 (TAT 6-24 HRS) Nasopharyngeal Nasopharyngeal Swab   Specimen: Nasopharyngeal Swab  Result Value Ref Range   SARS  Coronavirus 2 POSITIVE (A) NEGATIVE  Basic metabolic panel  Result Value Ref Range   Sodium 137 135 - 145 mmol/L   Potassium 3.7 3.5 - 5.1 mmol/L   Chloride 97 (L) 98 - 111 mmol/L   CO2 29 22 - 32 mmol/L   Glucose, Bld 101 (H) 70 - 99 mg/dL   BUN 17 8 - 23 mg/dL   Creatinine, Ser 0.77 0.61 - 1.24 mg/dL   Calcium 9.0 8.9 - 10.3 mg/dL   GFR, Estimated >60 >60 mL/min   Anion gap 11 5 - 15  CBC  Result Value Ref Range   WBC 3.1 (L) 4.0 - 10.5 K/uL   RBC 5.54 4.22 - 5.81 MIL/uL   Hemoglobin 16.3 13.0 - 17.0 g/dL   HCT 46.4 39.0 - 52.0 %   MCV 83.8 80.0 - 100.0 fL   MCH 29.4 26.0 - 34.0 pg   MCHC 35.1 30.0 - 36.0 g/dL   RDW 12.2 11.5 - 15.5 %   Platelets 143 (L) 150 - 400 K/uL   nRBC 0.0 0.0 - 0.2 %  Troponin I (High Sensitivity)  Result Value Ref Range   Troponin I (High Sensitivity) 7 <18 ng/L  Troponin I (High Sensitivity)  Result Value Ref Range   Troponin I (High Sensitivity) 6 <18 ng/L      Assessment & Plan:   Problem List Items Addressed This Visit       Cardiovascular and Mediastinum   Hypertension    Chronic.  Controlled.  Continue with current medication regimen.  Followed by cardiology.  Refills sent today.  Labs ordered today.  Return to clinic in 6 months for reevaluation.  Call sooner if concerns arise.        Relevant Medications   enalapril (VASOTEC) 10 MG tablet   hydrochlorothiazide (HYDRODIURIL) 12.5 MG tablet   metoprolol tartrate (LOPRESSOR) 25 MG tablet   simvastatin (ZOCOR) 40 MG tablet   CAD (coronary artery disease)    Chronic.  Controlled.  Continue with current medication regimen.  Followed by cardiology.  Refills sent today.  Labs ordered today.  Return to clinic in 6 months for reevaluation.  Call sooner if concerns arise.         Relevant Medications   clopidogrel (PLAVIX) 75 MG tablet   enalapril (VASOTEC) 10 MG tablet   hydrochlorothiazide (HYDRODIURIL) 12.5 MG tablet   metoprolol tartrate (LOPRESSOR) 25 MG tablet   simvastatin  (ZOCOR) 40 MG tablet     Endocrine   IFG (impaired fasting glucose)    Labs ordered today. Will make recommendations based on lab results.        Relevant Orders   HgB A1c     Other   Hyperlipidemia - Primary      Chronic.  Controlled.  Continue with current medication regimen.  Followed by cardiology.  Refills sent today.  Labs ordered today.  Return to clinic in 6 months for reevaluation.  Call sooner if concerns arise.        Relevant Medications   enalapril (VASOTEC) 10 MG tablet   hydrochlorothiazide (HYDRODIURIL) 12.5 MG tablet   metoprolol tartrate (LOPRESSOR) 25 MG tablet   simvastatin (ZOCOR) 40 MG tablet   Other Relevant Orders   Comp Met (CMET)   Lipid Profile   Urinalysis, Routine w reflex microscopic   Other Visit Diagnoses     Benign prostatic hyperplasia with weak urinary stream       Well controlled on Tamsulosin.  Refills sent during visit today. Last PSA was October 21.   Relevant Orders   Comp Met (CMET)   Lipid Profile   Urinalysis, Routine w reflex microscopic   Essential hypertension       Relevant Medications   enalapril (VASOTEC) 10 MG tablet   hydrochlorothiazide (HYDRODIURIL) 12.5 MG tablet   metoprolol tartrate (LOPRESSOR) 25 MG tablet   simvastatin (ZOCOR) 40 MG tablet   Other Relevant Orders   Comp Met (CMET)   Lipid Profile   Urinalysis, Routine w reflex microscopic        Follow up plan: Return in about 6 months (around 02/09/2021) for Physical and Fasting labs.       

## 2020-08-09 NOTE — Assessment & Plan Note (Signed)
Chronic.  Controlled.  Continue with current medication regimen.  Followed by cardiology.  Refills sent today.  Labs ordered today.  Return to clinic in 6 months for reevaluation.  Call sooner if concerns arise.

## 2020-08-09 NOTE — Telephone Encounter (Signed)
finasteride (PROSCAR) 5 MG tablet [id number]    Pt's wife called and reported that the patient is missing his Rx, [employer]

## 2020-08-09 NOTE — Telephone Encounter (Signed)
Discontinued:   (Completed Course) Call sent to office for review of request.

## 2020-08-09 NOTE — Telephone Encounter (Signed)
Please advise 

## 2020-08-09 NOTE — Assessment & Plan Note (Signed)
Labs ordered today.  Will make recommendations based on lab results. ?

## 2020-08-09 NOTE — Telephone Encounter (Signed)
Patient decided to do appointment today with Santiago Glad at 10:40 AM.

## 2020-08-09 NOTE — Telephone Encounter (Signed)
Pt presented in office today for refill. States that he was suppose to get a year supply at his last visit in October 2021. Pt was issued 90 day supply and was suppose to follow up in April. Offered pt appt today with Santiago Glad he states that he can't do today but he will contact his heart doctor who he seen in March for refills.

## 2020-08-09 NOTE — Telephone Encounter (Signed)
Requested medications are due for refill today.  yes  Requested medications are on the active medications list.  yes  Last refill. 11/04/2019  Future visit scheduled.   no  Notes to clinic.  All 7 were refilled on the same date. Pt is under Jon Billings - but has not seen her.

## 2020-08-10 LAB — LIPID PANEL
Chol/HDL Ratio: 2.9 ratio (ref 0.0–5.0)
Cholesterol, Total: 141 mg/dL (ref 100–199)
HDL: 48 mg/dL (ref >39)
LDL Chol Calc (NIH): 80 mg/dL (ref 0–99)
Triglycerides: 63 mg/dL (ref 0–149)
VLDL Cholesterol Cal: 13 mg/dL (ref 5–40)

## 2020-08-10 LAB — COMPREHENSIVE METABOLIC PANEL
ALT: 10 IU/L (ref 0–44)
AST: 15 IU/L (ref 0–40)
Albumin/Globulin Ratio: 2.6 — ABNORMAL HIGH (ref 1.2–2.2)
Albumin: 4.5 g/dL (ref 3.8–4.8)
Alkaline Phosphatase: 112 IU/L (ref 44–121)
BUN/Creatinine Ratio: 20 (ref 10–24)
BUN: 16 mg/dL (ref 8–27)
Bilirubin Total: 0.8 mg/dL (ref 0.0–1.2)
CO2: 27 mmol/L (ref 20–29)
Calcium: 9.4 mg/dL (ref 8.6–10.2)
Chloride: 102 mmol/L (ref 96–106)
Creatinine, Ser: 0.8 mg/dL (ref 0.76–1.27)
Globulin, Total: 1.7 g/dL (ref 1.5–4.5)
Glucose: 110 mg/dL — ABNORMAL HIGH (ref 65–99)
Potassium: 4.1 mmol/L (ref 3.5–5.2)
Sodium: 140 mmol/L (ref 134–144)
Total Protein: 6.2 g/dL (ref 6.0–8.5)
eGFR: 98 mL/min/{1.73_m2} (ref >59)

## 2020-08-10 LAB — HEMOGLOBIN A1C
Est. average glucose Bld gHb Est-mCnc: 120 mg/dL
Hgb A1c MFr Bld: 5.8 % — ABNORMAL HIGH (ref 4.8–5.6)

## 2020-08-10 MED ORDER — FINASTERIDE 5 MG PO TABS: 5.0000 mg | ORAL_TABLET | Freq: Every day | ORAL | 1 refills | Status: DC

## 2020-08-10 NOTE — Telephone Encounter (Signed)
Left message for patient to give our office a call back.

## 2020-08-10 NOTE — Telephone Encounter (Signed)
Pt called back he states that yes he is he only has 1 or 2 tablets left. He states that Dr Wynetta Emery that done his surgery and [facility] clinic dr says that he will probably be on this for the rest of his life due to his prostate swelling. Pt would like medicine sent to the pharmacy.

## 2020-08-10 NOTE — Progress Notes (Signed)
Hi Mr. Scharr. Your lab work looks good. Cholesterol, liver, kidneys and electrolytes look good. Your A1c remains well controled at 5.8.  Please let me knwo if you have any questions. Continue with current medication regimen. Follow up as discussed.

## 2020-08-10 NOTE — Telephone Encounter (Signed)
Patient and wife Robert Zuniga called in to inquire of Jon Billings about the medication Finasteride would like a call back please. Per patient he need to take this daily because it keep his prostate from swelling up. Ph# [phone]

## 2020-08-10 NOTE — Telephone Encounter (Signed)
Refill sent to the pharmacy for patient.

## 2020-08-10 NOTE — Telephone Encounter (Signed)
Please advise 

## 2020-08-10 NOTE — Telephone Encounter (Signed)
Patient stated he was only taking the tamsulosin now. If he is still taking it I can send it to the pharmacy.

## 2020-11-09 ENCOUNTER — Other Ambulatory Visit: Payer: Self-pay | Admitting: Family Medicine

## 2020-12-18 ENCOUNTER — Telehealth: Payer: Self-pay | Admitting: Nurse Practitioner

## 2020-12-18 NOTE — Telephone Encounter (Signed)
Pt's wife wanting pt's last PSA. Pt was with her. Informed wife pt's PSA was 1 on 11/04/19. Wife verbalized understanding.

## 2021-02-06 ENCOUNTER — Other Ambulatory Visit: Payer: Self-pay | Admitting: Family Medicine

## 2021-02-06 NOTE — Telephone Encounter (Signed)
Requested medication (s) are due for refill today - expired Rx  Requested medication (s) are on the active medication list -yes  Future visit scheduled -yes  Last refill: 12/15/19 12ml 4RF  Notes to clinic: Request RF: Expired Rx  Requested Prescriptions  Pending Prescriptions Disp Refills   betamethasone dipropionate 0.05 % lotion [Pharmacy Med Name: BETAMETHASONE DP 0.05% LOT] 60 mL 4    Sig: APPLY TO AFFECTED AREA(S) AS DIRECTED. DO NOT USE FOR MORE THAN 2 WEEKS IN THE SAME AREA     Off-Protocol Failed - 02/06/2021 11:07 AM      Failed - Medication not assigned to a protocol, review manually.      Passed - Valid encounter within last 12 months    Recent Outpatient Visits           6 months ago Mixed hyperlipidemia   [facility] Jon Billings, NP   1 year ago Routine general medical examination at a health care facility   [facility], [place], DO   1 year ago Essential hypertension   [facility] Volney American, [place]   2 years ago PE (physical exam), annual   [place], Lilia Argue, [place]   2 years ago PE (physical exam), annual   [place], [place]       Future Appointments             In 5 days Jon Billings, NP [facility]               Requested Prescriptions  Pending Prescriptions Disp Refills   betamethasone dipropionate 0.05 % lotion [Pharmacy Med Name: BETAMETHASONE DP 0.05% LOT] 60 mL 4    Sig: APPLY TO AFFECTED AREA(S) AS DIRECTED. DO NOT USE FOR MORE THAN 2 WEEKS IN THE SAME AREA     Off-Protocol Failed - 02/06/2021 11:07 AM      Failed - Medication not assigned to a protocol, review manually.      Passed - Valid encounter within last 12 months    Recent Outpatient Visits           6 months ago Mixed hyperlipidemia   [facility] Jon Billings, NP   1 year ago Routine general medical examination at  a health care facility   [facility], [place], DO   1 year ago Essential hypertension   [facility] Volney American, [place]   2 years ago PE (physical exam), annual   [facility] Volney American, [place]   2 years ago PE (physical exam), annual   [place], [place]       Future Appointments             In 5 days Jon Billings, NP [facility], [place]

## 2021-02-07 ENCOUNTER — Other Ambulatory Visit: Payer: Self-pay

## 2021-02-07 MED ORDER — KETOCONAZOLE 2 % EX SHAM: MEDICATED_SHAMPOO | CUTANEOUS | 3 refills | Status: DC

## 2021-02-08 NOTE — Progress Notes (Deleted)
° °There were no vitals taken for this visit.  ° °Subjective:  ° ° Patient ID: Robert Zuniga, male    DOB: 06/28/1955, 65 y.o.   MRN: [id number] ° °HPI: °Robert Zuniga is a 65 y.o. male ° °No chief complaint on file. ° °HYPERTENSION / HYPERLIPIDEMIA °Satisfied with current treatment? no °Duration of hypertension: years °BP monitoring frequency: not checking °BP range:  °BP medication side effects: no °Past BP meds:  Metoprolol and enalapril and HCTZ °Duration of hyperlipidemia: years °Cholesterol medication side effects: no °Cholesterol supplements: none °Past cholesterol medications: simvastatin (zocor) °Medication compliance: excellent compliance °Aspirin: yes °Recent stressors: no °Recurrent headaches: no °Visual changes: no °Palpitations: no °Dyspnea: no °Chest pain: no °Lower extremity edema: no °Dizzy/lightheaded: no ° °BPH °BPH status: controlled °Satisfied with current treatment?: yes °Medication side effects: no °Medication compliance: excellent compliance °Duration: years °Nocturia: no °Urinary frequency:no °Incomplete voiding: no °Urgency: no °Weak urinary stream: no °Straining to start stream: no °Dysuria: no °Onset: gradual °Severity: severe ° °Relevant past medical, surgical, family and social history reviewed and updated as indicated. Interim medical history since our last visit reviewed. °Allergies and medications reviewed and updated. ° °Review of Systems  °Eyes:  Negative for visual disturbance.  °Respiratory:  Negative for chest tightness and shortness of breath.   °Cardiovascular:  Negative for chest pain, palpitations and leg swelling.  °Neurological:  Negative for dizziness, light-headedness, numbness and headaches.  ° °Per HPI unless specifically indicated above ° °   °Objective:  °  °There were no vitals taken for this visit.  °Wt Readings from Last 3 Encounters:  °08/09/20 175 lb 8 oz (79.6 kg)  °01/19/20 179 lb (81.2 kg)  °11/04/19 172 lb (78 kg)  °  °Physical Exam °Vitals and nursing note  reviewed.  °Constitutional:   °   General: He is not in acute distress. °   Appearance: Normal appearance. He is not ill-appearing, toxic-appearing or diaphoretic.  °HENT:  °   Head: Normocephalic.  °   Right Ear: External ear normal.  °   Left Ear: External ear normal.  °   Nose: Nose normal. No congestion or rhinorrhea.  °   Mouth/Throat:  °   Mouth: Mucous membranes are moist.  °Eyes:  °   General:     °   Right eye: No discharge.     °   Left eye: No discharge.  °   Extraocular Movements: Extraocular movements intact.  °   Conjunctiva/sclera: Conjunctivae normal.  °   Pupils: Pupils are equal, round, and reactive to light.  °Cardiovascular:  °   Rate and Rhythm: Normal rate and regular rhythm.  °   Heart sounds: No murmur heard. °Pulmonary:  °   Effort: Pulmonary effort is normal. No respiratory distress.  °   Breath sounds: Normal breath sounds. No wheezing, rhonchi or rales.  °Abdominal:  °   General: Abdomen is flat. Bowel sounds are normal.  °Musculoskeletal:  °   Cervical back: Normal range of motion and neck supple.  °Skin: °   General: Skin is warm and dry.  °   Capillary Refill: Capillary refill takes less than 2 seconds.  °Neurological:  °   General: No focal deficit present.  °   Mental Status: He is alert and oriented to person, place, and time.  °Psychiatric:     °   Mood and Affect: Mood normal.     °   Behavior: Behavior normal.     °     Thought Content: Thought content normal.     °   Judgment: Judgment normal.  ° ° °Results for orders placed or performed in visit on 08/09/20  °Microscopic Examination  ° Urine  °Result Value Ref Range  ° WBC, UA None seen 0 - 5 /hpf  ° RBC 0-2 0 - 2 /hpf  ° Epithelial Cells (non renal) None seen 0 - 10 /hpf  ° Mucus, UA Present (A) Not Estab.  ° Bacteria, UA None seen None seen/Few  °Comp Met (CMET)  °Result Value Ref Range  ° Glucose 110 (H) 65 - 99 mg/dL  ° BUN 16 8 - 27 mg/dL  ° Creatinine, Ser 0.80 0.76 - 1.27 mg/dL  ° eGFR 98 >59 mL/min/1.73  ° BUN/Creatinine  Ratio 20 10 - 24  ° Sodium 140 134 - 144 mmol/L  ° Potassium 4.1 3.5 - 5.2 mmol/L  ° Chloride 102 96 - 106 mmol/L  ° CO2 27 20 - 29 mmol/L  ° Calcium 9.4 8.6 - 10.2 mg/dL  ° Total Protein 6.2 6.0 - 8.5 g/dL  ° Albumin 4.5 3.8 - 4.8 g/dL  ° Globulin, Total 1.7 1.5 - 4.5 g/dL  ° Albumin/Globulin Ratio 2.6 (H) 1.2 - 2.2  ° Bilirubin Total 0.8 0.0 - 1.2 mg/dL  ° Alkaline Phosphatase 112 44 - 121 IU/L  ° AST 15 0 - 40 IU/L  ° ALT 10 0 - 44 IU/L  °Lipid Profile  °Result Value Ref Range  ° Cholesterol, Total 141 100 - 199 mg/dL  ° Triglycerides 63 0 - 149 mg/dL  ° HDL 48 >39 mg/dL  ° VLDL Cholesterol Cal 13 5 - 40 mg/dL  ° LDL Chol Calc (NIH) 80 0 - 99 mg/dL  ° Chol/HDL Ratio 2.9 0.0 - 5.0 ratio  °Urinalysis, Routine w reflex microscopic  °Result Value Ref Range  ° Specific Gravity, UA >1.030 (H) 1.005 - 1.030  ° pH, UA 5.5 5.0 - 7.5  ° Color, UA Yellow Yellow  ° Appearance Ur Clear Clear  ° Leukocytes,UA Negative Negative  ° Protein,UA 1+ (A) Negative/Trace  ° Glucose, UA 1+ (A) Negative  ° Ketones, UA Negative Negative  ° RBC, UA Trace (A) Negative  ° Bilirubin, UA Negative Negative  ° Urobilinogen, Ur 1.0 0.2 - 1.0 mg/dL  ° Nitrite, UA Negative Negative  ° Microscopic Examination See below:   °HgB A1c  °Result Value Ref Range  ° Hgb A1c MFr Bld 5.8 (H) 4.8 - 5.6 %  ° Est. average glucose Bld gHb Est-mCnc 120 mg/dL  ° °   °Assessment & Plan:  ° °Problem List Items Addressed This Visit   ° °  ° Cardiovascular and Mediastinum  ° Hypertension  ° CAD (coronary artery disease) - Primary  °  ° Endocrine  ° IFG (impaired fasting glucose)  °  ° Other  ° Hyperlipidemia  °  ° °Follow up plan: °No follow-ups on file. ° ° ° ° ° ° °

## 2021-02-11 ENCOUNTER — Ambulatory Visit (INDEPENDENT_AMBULATORY_CARE_PROVIDER_SITE_OTHER): Payer: No Typology Code available for payment source | Admitting: Nurse Practitioner

## 2021-02-11 ENCOUNTER — Other Ambulatory Visit: Payer: Self-pay

## 2021-02-11 ENCOUNTER — Encounter: Payer: Self-pay | Admitting: Nurse Practitioner

## 2021-02-11 VITALS — BP 137/80 | HR 64 | Temp 98.1°F | Ht 68.31 in | Wt 183.0 lb

## 2021-02-11 DIAGNOSIS — N400 Enlarged prostate without lower urinary tract symptoms: Secondary | ICD-10-CM

## 2021-02-11 DIAGNOSIS — Z1211 Encounter for screening for malignant neoplasm of colon: Secondary | ICD-10-CM

## 2021-02-11 DIAGNOSIS — Z0000 Encounter for general adult medical examination without abnormal findings: Secondary | ICD-10-CM | POA: Diagnosis not present

## 2021-02-11 DIAGNOSIS — I2583 Coronary atherosclerosis due to lipid rich plaque: Secondary | ICD-10-CM

## 2021-02-11 DIAGNOSIS — R7301 Impaired fasting glucose: Secondary | ICD-10-CM | POA: Diagnosis not present

## 2021-02-11 DIAGNOSIS — E782 Mixed hyperlipidemia: Secondary | ICD-10-CM

## 2021-02-11 DIAGNOSIS — Z136 Encounter for screening for cardiovascular disorders: Secondary | ICD-10-CM | POA: Diagnosis not present

## 2021-02-11 DIAGNOSIS — I10 Essential (primary) hypertension: Secondary | ICD-10-CM

## 2021-02-11 DIAGNOSIS — I2510 Atherosclerotic heart disease of native coronary artery without angina pectoris: Secondary | ICD-10-CM | POA: Diagnosis not present

## 2021-02-11 LAB — MICROSCOPIC EXAMINATION

## 2021-02-11 LAB — URINALYSIS, ROUTINE W REFLEX MICROSCOPIC
Bilirubin, UA: NEGATIVE
Leukocytes,UA: NEGATIVE
Nitrite, UA: NEGATIVE
Specific Gravity, UA: >1.03 — ABNORMAL HIGH (ref 1.005–1.030)
Urobilinogen, Ur: 1 mg/dL (ref 0.2–1.0)
pH, UA: 5.5 (ref 5.0–7.5)

## 2021-02-11 MED ORDER — FINASTERIDE 5 MG PO TABS: 5.0000 mg | ORAL_TABLET | Freq: Every day | ORAL | 1 refills | Status: DC

## 2021-02-11 NOTE — Assessment & Plan Note (Signed)
Chronic.  Controlled.  Continue with current medication regimen.  Labs ordered today.  Return to clinic in 6 months for reevaluation.  Call sooner if concerns arise.  ? ?

## 2021-02-11 NOTE — Assessment & Plan Note (Signed)
Labs ordered today.  Will make recommendations based on lab results. ?

## 2021-02-11 NOTE — Progress Notes (Signed)
BP 137/80    Pulse 64    Temp 98.1 F (36.7 C) (Oral)    Ht 5' 8.31" (1.735 m)    Wt 183 lb (83 kg)    SpO2 98%    BMI 27.58 kg/m    Subjective:    Patient ID: Robert Zuniga, male    DOB: 1955-07-01, 66 y.o.   MRN: [id number]  HPI: Robert Zuniga is a 66 y.o. male presenting on 02/11/2021 for comprehensive medical examination. Current medical complaints include:none  He currently lives with: Interim Problems from his last visit: no  HYPERTENSION / HYPERLIPIDEMIA Satisfied with current treatment? no Duration of hypertension: years BP monitoring frequency: not checking BP range:  BP medication side effects: no Past BP meds: Metoprolol and enalapril and HCTZ Duration of hyperlipidemia: years Cholesterol medication side effects: no Cholesterol supplements: none Past cholesterol medications: simvastatin (zocor) Medication compliance: excellent compliance Aspirin: yes Recent stressors: no Recurrent headaches: no Visual changes: no Palpitations: no Dyspnea: no Chest pain: no Lower extremity edema: no Dizzy/lightheaded: no  BPH BPH status: controlled Satisfied with current treatment?: yes Medication side effects: no Medication compliance: excellent compliance Duration: years Nocturia: no Urinary frequency:no Incomplete voiding: no Urgency: no Weak urinary stream: no Straining to start stream: no Dysuria: no Onset: gradual Severity: severe Depression Screen done today and results listed below:  Depression screen [facility] 2/9 02/11/2021 08/09/2020 11/04/2019 11/04/2019 10/27/2018  Decreased Interest 0 0 0 0 0  Down, Depressed, Hopeless 0 0 0 0 0  PHQ - 2 Score 0 0 0 0 0  Altered sleeping 0 - - 0 0  Tired, decreased energy 0 - - 0 0  Change in appetite 0 - - 0 0  Feeling bad or failure about yourself  0 - - 0 0  Trouble concentrating 0 - - 0 0  Moving slowly or fidgety/restless 0 - - 0 0  Suicidal thoughts - - - 0 0  PHQ-9 Score 0 - - 0 0  Difficult doing work/chores  Not difficult at all - - Not difficult at all -    The patient does not have a history of falls. I did complete a risk assessment for falls. A plan of care for falls was documented.   Past Medical History:  Past Medical History:  Diagnosis Date   Bladder hemorrhage 2015   CAD (coronary artery disease)    Hyperlipidemia    Hypertension    MI (myocardial infarction) ([place])    X3   Psoriasis     Surgical History:  Past Surgical History:  Procedure Laterality Date   CORONARY ANGIOPLASTY WITH STENT PLACEMENT     CYSTOSTOMY W/ BLADDER BIOPSY     JOINT REPLACEMENT Bilateral    hips   PROSTATE SURGERY  2015   polyps -resolved    Medications:  Current Outpatient Medications on File Prior to Visit  Medication Sig   aspirin EC 81 MG tablet Take 81 mg by mouth daily.   betamethasone dipropionate 0.05 % lotion APPLY TO AFFECTED AREA(S) AS DIRECTED DO NOT USE FOR MORE THAN 2 WEEKS IN THE SAME AREA   calcium & magnesium carbonates (MYLANTA) 370-488 MG tablet Take 1 tablet by mouth daily.   clopidogrel (PLAVIX) 75 MG tablet Take 1 tablet (75 mg total) by mouth daily.   enalapril (VASOTEC) 10 MG tablet Take 1 tablet (10 mg total) by mouth daily.   hydrochlorothiazide (HYDRODIURIL) 12.5 MG tablet Take 1 tablet (12.5 mg total) by mouth daily.   ketoconazole (  NIZORAL) 2 % shampoo USE 1 APPLICATION TOPICALLY TWICE A WEEK. Strength: 2 %   metoprolol tartrate (LOPRESSOR) 25 MG tablet Take 1 tablet (25 mg total) by mouth daily.   simvastatin (ZOCOR) 40 MG tablet Take 1 tablet (40 mg total) by mouth daily.   tamsulosin (FLOMAX) 0.4 MG CAPS capsule Take 1 capsule (0.4 mg total) by mouth daily.   No current facility-administered medications on file prior to visit.    Allergies:  Allergies  Allergen Reactions   Morphine Nausea Only and Nausea And Vomiting   Haemophilus Influenzae Other (See Comments)    Joint pain Joint pain   Influenza Vaccine Recombinant Other (See Comments)    Joint pain    Levaquin [Levofloxacin In D5w] Nausea And Vomiting   Niaspan [Niacin Er] Hives   Pneumovax [Pneumococcal Polysaccharide Vaccine] Other (See Comments)    Joint pain    Social History:  Social History   Socioeconomic History   Marital status: Married    Spouse name: Not on file   Number of children: Not on file   Years of education: Not on file   Highest education level: Not on file  Occupational History   Not on file  Tobacco Use   Smoking status: Former    Packs/day: 1.00    Years: 2.00    Pack years: 2.00    Types: Cigarettes    Quit date: 09/12/1987    Years since quitting: 33.4   Smokeless tobacco: Never  Substance and Sexual Activity   Alcohol use: No    Alcohol/week: 0.0 standard drinks   Drug use: No   Sexual activity: Not on file  Other Topics Concern   Not on file  Social History Narrative   Not on file   Social Determinants of Health   Financial Resource Strain: Not on file  Food Insecurity: Not on file  Transportation Needs: Not on file  Physical Activity: Not on file  Stress: Not on file  Social Connections: Not on file  Intimate Partner Violence: Not on file   Social History   Tobacco Use  Smoking Status Former   Packs/day: 1.00   Years: 2.00   Pack years: 2.00   Types: Cigarettes   Quit date: 09/12/1987   Years since quitting: 33.4  Smokeless Tobacco Never   Social History   Substance and Sexual Activity  Alcohol Use No   Alcohol/week: 0.0 standard drinks    Family History:  Family History  Problem Relation Age of Onset   Diabetes Mother    Dementia Mother    Heart disease Father    Cancer Sister    Liver disease Sister    Heart disease Maternal Grandfather    Heart disease Paternal Grandfather     Past medical history, surgical history, medications, allergies, family history and social history reviewed with patient today and changes made to appropriate areas of the chart.   Review of Systems  Eyes:  Negative for blurred  vision and double vision.  Respiratory:  Negative for shortness of breath.   Cardiovascular:  Negative for chest pain, palpitations and leg swelling.  Neurological:  Negative for dizziness and headaches.  All other ROS negative except what is listed above and in the HPI.      Objective:    BP 137/80    Pulse 64    Temp 98.1 F (36.7 C) (Oral)    Ht 5' 8.31" (1.735 m)    Wt 183 lb (83 kg)  SpO2 98%    BMI 27.58 kg/m   Wt Readings from Last 3 Encounters:  02/11/21 183 lb (83 kg)  08/09/20 175 lb 8 oz (79.6 kg)  01/19/20 179 lb (81.2 kg)    Physical Exam Vitals and nursing note reviewed.  Constitutional:      General: He is not in acute distress.    Appearance: Normal appearance. He is not ill-appearing, toxic-appearing or diaphoretic.  HENT:     Head: Normocephalic.     Right Ear: Tympanic membrane, ear canal and external ear normal.     Left Ear: Tympanic membrane, ear canal and external ear normal.     Nose: Nose normal. No congestion or rhinorrhea.     Mouth/Throat:     Mouth: Mucous membranes are moist.  Eyes:     General:        Right eye: No discharge.        Left eye: No discharge.     Extraocular Movements: Extraocular movements intact.     Conjunctiva/sclera: Conjunctivae normal.     Pupils: Pupils are equal, round, and reactive to light.  Cardiovascular:     Rate and Rhythm: Normal rate and regular rhythm.     Heart sounds: No murmur heard. Pulmonary:     Effort: Pulmonary effort is normal. No respiratory distress.     Breath sounds: Normal breath sounds. No wheezing, rhonchi or rales.  Abdominal:     General: Abdomen is flat. Bowel sounds are normal. There is no distension.     Palpations: Abdomen is soft.     Tenderness: There is no abdominal tenderness. There is no guarding.  Musculoskeletal:     Cervical back: Normal range of motion and neck supple.  Skin:    General: Skin is warm and dry.     Capillary Refill: Capillary refill takes less than 2  seconds.  Neurological:     General: No focal deficit present.     Mental Status: He is alert and oriented to person, place, and time.     Cranial Nerves: No cranial nerve deficit.     Motor: No weakness.     Deep Tendon Reflexes: Reflexes normal.  Psychiatric:        Mood and Affect: Mood normal.        Behavior: Behavior normal.        Thought Content: Thought content normal.        Judgment: Judgment normal.    Results for orders placed or performed in visit on 08/09/20  Microscopic Examination   Urine  Result Value Ref Range   WBC, UA None seen 0 - 5 /hpf   RBC 0-2 0 - 2 /hpf   Epithelial Cells (non renal) None seen 0 - 10 /hpf   Mucus, UA Present (A) Not Estab.   Bacteria, UA None seen None seen/Few  Comp Met (CMET)  Result Value Ref Range   Glucose 110 (H) 65 - 99 mg/dL   BUN 16 8 - 27 mg/dL   Creatinine, Ser 0.80 0.76 - 1.27 mg/dL   eGFR 98 >59 mL/min/1.73   BUN/Creatinine Ratio 20 10 - 24   Sodium 140 134 - 144 mmol/L   Potassium 4.1 3.5 - 5.2 mmol/L   Chloride 102 96 - 106 mmol/L   CO2 27 20 - 29 mmol/L   Calcium 9.4 8.6 - 10.2 mg/dL   Total Protein 6.2 6.0 - 8.5 g/dL   Albumin 4.5 3.8 - 4.8 g/dL   Globulin,  Total 1.7 1.5 - 4.5 g/dL   Albumin/Globulin Ratio 2.6 (H) 1.2 - 2.2   Bilirubin Total 0.8 0.0 - 1.2 mg/dL   Alkaline Phosphatase 112 44 - 121 IU/L   AST 15 0 - 40 IU/L   ALT 10 0 - 44 IU/L  Lipid Profile  Result Value Ref Range   Cholesterol, Total 141 100 - 199 mg/dL   Triglycerides 63 0 - 149 mg/dL   HDL 48 >39 mg/dL   VLDL Cholesterol Cal 13 5 - 40 mg/dL   LDL Chol Calc (NIH) 80 0 - 99 mg/dL   Chol/HDL Ratio 2.9 0.0 - 5.0 ratio  Urinalysis, Routine w reflex microscopic  Result Value Ref Range   Specific Gravity, UA >1.030 (H) 1.005 - 1.030   pH, UA 5.5 5.0 - 7.5   Color, UA Yellow Yellow   Appearance Ur Clear Clear   Leukocytes,UA Negative Negative   Protein,UA 1+ (A) Negative/Trace   Glucose, UA 1+ (A) Negative   Ketones, UA Negative  Negative   RBC, UA Trace (A) Negative   Bilirubin, UA Negative Negative   Urobilinogen, Ur 1.0 0.2 - 1.0 mg/dL   Nitrite, UA Negative Negative   Microscopic Examination See below:   HgB A1c  Result Value Ref Range   Hgb A1c MFr Bld 5.8 (H) 4.8 - 5.6 %   Est. average glucose Bld gHb Est-mCnc 120 mg/dL      Assessment & Plan:   Problem List Items Addressed This Visit       Cardiovascular and Mediastinum   Hypertension    Chronic.  Controlled.  Continue with current medication regimen on Metoprolol 54m daily, Enalapril 186mdaily, and HCTZ 12.26m77maily.  Labs ordered today.  Return to clinic in 6 months for reevaluation.  Call sooner if concerns arise.        CAD (coronary artery disease)    Chronic.  Controlled.  Continue with current medication regimen of Simvastatin 25m52mily.  Labs ordered today.  Return to clinic in 6 months for reevaluation.  Call sooner if concerns arise.          Endocrine   IFG (impaired fasting glucose)    Labs ordered today. Will make recommendations based on lab results.       Relevant Orders   HgB A1c     Genitourinary   BPH (benign prostatic hyperplasia)    Chronic.  Controlled.  Continue with current medication regimen.  Labs ordered today.  Return to clinic in 6 months for reevaluation.  Call sooner if concerns arise.        Relevant Medications   finasteride (PROSCAR) 5 MG tablet     Other   Hyperlipidemia    Chronic.  Controlled.  Continue with current medication regimen on Simvastatin daily.  Labs ordered today.  Return to clinic in 6 months for reevaluation.  Call sooner if concerns arise.        Relevant Orders   Lipid panel   Other Visit Diagnoses     Annual physical exam    -  Primary   Health maintenance reviewed during visit today. Labs ordered. Not able to get vaccines due to adverse reaction.  Cologuard ordered.    Relevant Medications   finasteride (PROSCAR) 5 MG tablet   Other Relevant Orders   TSH   PSA    Lipid panel   CBC with Differential/Platelet   Comprehensive metabolic panel   Urinalysis, Routine w reflex microscopic   Screening for  ischemic heart disease       Relevant Orders   Lipid panel   Screening for colon cancer       Relevant Orders   Cologuard        Discussed aspirin prophylaxis for myocardial infarction prevention and decision was made to continue ASA  LABORATORY TESTING:  Health maintenance labs ordered today as discussed above.   The natural history of prostate cancer and ongoing controversy regarding screening and potential treatment outcomes of prostate cancer has been discussed with the patient. The meaning of a false positive PSA and a false negative PSA has been discussed. He indicates understanding of the limitations of this screening test and wishes to proceed with screening PSA testing.   IMMUNIZATIONS:   Not able to get vaccines due to adverse reaction of joint pain - Tdap: Tetanus vaccination status reviewed: last tetanus booster within 10 years. - Influenza: Refused - Pneumovax: Not applicable - Prevnar: Not applicable - COVID: Not applicable - HPV: Not applicable - Shingrix vaccine: Not applicable  SCREENING: - Colonoscopy:  Cologuard ordered   Discussed with patient purpose of the colonoscopy is to detect colon cancer at curable precancerous or early stages   - AAA Screening: Not applicable  -Hearing Test: Not applicable  -Spirometry: Not applicable   PATIENT COUNSELING:    Sexuality: Discussed sexually transmitted diseases, partner selection, use of condoms, avoidance of unintended pregnancy  and contraceptive alternatives.   Advised to avoid cigarette smoking.  I discussed with the patient that most people either abstain from alcohol or drink within safe limits (<=14/week and <=4 drinks/occasion for males, <=7/weeks and <= 3 drinks/occasion for females) and that the risk for alcohol disorders and other health effects rises proportionally  with the number of drinks per week and how often a drinker exceeds daily limits.  Discussed cessation/primary prevention of drug use and availability of treatment for abuse.   Diet: Encouraged to adjust caloric intake to maintain  or achieve ideal body weight, to reduce intake of dietary saturated fat and total fat, to limit sodium intake by avoiding high sodium foods and not adding table salt, and to maintain adequate dietary potassium and calcium preferably from fresh fruits, vegetables, and low-fat dairy products.    stressed the importance of regular exercise  Injury prevention: Discussed safety belts, safety helmets, smoke detector, smoking near bedding or upholstery.   Dental health: Discussed importance of regular tooth brushing, flossing, and dental visits.   Follow up plan: NEXT PREVENTATIVE PHYSICAL DUE IN 1 YEAR. Return in about 6 months (around 08/11/2021) for HTN, HLD, DM2 FU.

## 2021-02-11 NOTE — Assessment & Plan Note (Signed)
Chronic.  Controlled.  Continue with current medication regimen of Simvastatin 40mg daily.  Labs ordered today.  Return to clinic in 6 months for reevaluation.  Call sooner if concerns arise.   

## 2021-02-11 NOTE — Assessment & Plan Note (Signed)
Chronic.  Controlled.  Continue with current medication regimen on Simvastatin daily.  Labs ordered today.  Return to clinic in 6 months for reevaluation.  Call sooner if concerns arise.

## 2021-02-11 NOTE — Assessment & Plan Note (Signed)
Chronic.  Controlled.  Continue with current medication regimen on Metoprolol 25mg daily, Enalapril 10mg daily, and HCTZ 12.5mg daily.  Labs ordered today.  Return to clinic in 6 months for reevaluation.  Call sooner if concerns arise.    

## 2021-02-12 LAB — COMPREHENSIVE METABOLIC PANEL
ALT: 24 IU/L (ref 0–44)
AST: 23 IU/L (ref 0–40)
Albumin/Globulin Ratio: 2 (ref 1.2–2.2)
Albumin: 4.5 g/dL (ref 3.8–4.8)
Alkaline Phosphatase: 118 IU/L (ref 44–121)
BUN/Creatinine Ratio: 22 (ref 10–24)
BUN: 17 mg/dL (ref 8–27)
Bilirubin Total: 0.8 mg/dL (ref 0.0–1.2)
CO2: 27 mmol/L (ref 20–29)
Calcium: 9.7 mg/dL (ref 8.6–10.2)
Chloride: 106 mmol/L (ref 96–106)
Creatinine, Ser: 0.79 mg/dL (ref 0.76–1.27)
Globulin, Total: 2.3 g/dL (ref 1.5–4.5)
Glucose: 121 mg/dL — ABNORMAL HIGH (ref 70–99)
Potassium: 4 mmol/L (ref 3.5–5.2)
Sodium: 144 mmol/L (ref 134–144)
Total Protein: 6.8 g/dL (ref 6.0–8.5)
eGFR: 98 mL/min/{1.73_m2} (ref >59)

## 2021-02-12 LAB — CBC WITH DIFFERENTIAL/PLATELET
Basophils Absolute: 0 10*3/uL (ref 0.0–0.2)
Basos: 1 %
EOS (ABSOLUTE): 0.2 10*3/uL (ref 0.0–0.4)
Eos: 5 %
Hematocrit: 50.8 % (ref 37.5–51.0)
Hemoglobin: 17.7 g/dL (ref 13.0–17.7)
Immature Grans (Abs): 0 10*3/uL (ref 0.0–0.1)
Immature Granulocytes: 0 %
Lymphocytes Absolute: 0.9 10*3/uL (ref 0.7–3.1)
Lymphs: 21 %
MCH: 29.9 pg (ref 26.6–33.0)
MCHC: 34.8 g/dL (ref 31.5–35.7)
MCV: 86 fL (ref 79–97)
Monocytes Absolute: 0.4 10*3/uL (ref 0.1–0.9)
Monocytes: 9 %
Neutrophils Absolute: 2.9 10*3/uL (ref 1.4–7.0)
Neutrophils: 64 %
Platelets: 152 10*3/uL (ref 150–450)
RBC: 5.91 x10E6/uL — ABNORMAL HIGH (ref 4.14–5.80)
RDW: 12.8 % (ref 11.6–15.4)
WBC: 4.4 10*3/uL (ref 3.4–10.8)

## 2021-02-12 LAB — LIPID PANEL
Chol/HDL Ratio: 3.4 ratio (ref 0.0–5.0)
Cholesterol, Total: 148 mg/dL (ref 100–199)
HDL: 44 mg/dL (ref >39)
LDL Chol Calc (NIH): 86 mg/dL (ref 0–99)
Triglycerides: 98 mg/dL (ref 0–149)
VLDL Cholesterol Cal: 18 mg/dL (ref 5–40)

## 2021-02-12 LAB — HEMOGLOBIN A1C
Est. average glucose Bld gHb Est-mCnc: 134 mg/dL
Hgb A1c MFr Bld: 6.3 % — ABNORMAL HIGH (ref 4.8–5.6)

## 2021-02-12 LAB — TSH: TSH: 1.08 u[IU]/mL (ref 0.450–4.500)

## 2021-02-12 LAB — PSA: Prostate Specific Ag, Serum: 0.9 ng/mL (ref 0.0–4.0)

## 2021-02-12 NOTE — Progress Notes (Signed)
Hi Mr. Robert Zuniga. It was good to see you yesterday.  Your lab work looks good.  A1c did bump up some to 6.3.  You are still in the prediabetic range but very close to the diabetic range. I recommend decreasing your carbohydrate intake.  Otherwise, your lab work looks good.  Please let me know if you have any questions. I will see you at our next appt in 6 months.

## 2021-02-13 ENCOUNTER — Other Ambulatory Visit: Payer: Self-pay

## 2021-02-13 MED ORDER — KETOCONAZOLE 2 % EX SHAM: MEDICATED_SHAMPOO | CUTANEOUS | 3 refills | Status: DC

## 2021-02-13 NOTE — Telephone Encounter (Signed)
Last RX not transmitted, please resend.

## 2021-03-11 ENCOUNTER — Telehealth: Payer: Self-pay | Admitting: Nurse Practitioner

## 2021-03-11 NOTE — Telephone Encounter (Signed)
Copied from [place] [id number]. Topic: Medicare AWV >> Mar 11, 2021 12:40 PM Lavonia Drafts wrote: Reason for CRM:  Left message for patient to call back and schedule Medicare Annual Wellness Visit (AWV) to be done virtually or by telephone.  No hx of AWV eligible as of 03/13/21  Please schedule at anytime with [facility]Nurse Health Advisor.      50 Minutes appointment   Any questions, please call me at [phone]

## 2021-03-19 ENCOUNTER — Ambulatory Visit (INDEPENDENT_AMBULATORY_CARE_PROVIDER_SITE_OTHER): Payer: No Typology Code available for payment source | Admitting: *Deleted

## 2021-03-19 DIAGNOSIS — Z0000 Encounter for general adult medical examination without abnormal findings: Secondary | ICD-10-CM

## 2021-03-19 NOTE — Patient Instructions (Addendum)
Robert Zuniga , Thank you for taking time to come for your Medicare Wellness Visit. I appreciate your ongoing commitment to your health goals. Please review the following plan we discussed and let me know if I can assist you in the future.   Screening recommendations/referrals: Colonoscopy: Education provided Recommended yearly ophthalmology/optometry visit for glaucoma screening and checkup Recommended yearly dental visit for hygiene and checkup  Vaccinations: Influenza vaccine: n/a Pneumococcal vaccine: n/a Tdap vaccine: up to date Shingles vaccine: n/a    Advanced directives: Education provided  Conditions/risks identified:   Next appointment: 08-12-2021 8:20 [facility]  Preventive Care 67 Years and Older, Male Preventive care refers to lifestyle choices and visits with your health care provider that can promote health and wellness. What does preventive care include? A yearly physical exam. This is also called an annual well check. Dental exams once or twice a year. Routine eye exams. Ask your health care provider how often you should have your eyes checked. Personal lifestyle choices, including: Daily care of your teeth and gums. Regular physical activity. Eating a healthy diet. Avoiding tobacco and drug use. Limiting alcohol use. Practicing safe sex. Taking low doses of aspirin every day. Taking vitamin and mineral supplements as recommended by your health care provider. What happens during an annual well check? The services and screenings done by your health care provider during your annual well check will depend on your age, overall health, lifestyle risk factors, and family history of disease. Counseling  Your health care provider may ask you questions about your: Alcohol use. Tobacco use. Drug use. Emotional well-being. Home and relationship well-being. Sexual activity. Eating habits. History of falls. Memory and ability to understand (cognition). Work and work  Statistician. Screening  You may have the following tests or measurements: Height, weight, and BMI. Blood pressure. Lipid and cholesterol levels. These may be checked every 5 years, or more frequently if you are over 49 years old. Skin check. Lung cancer screening. You may have this screening every year starting at age 41 if you have a 30-pack-year history of smoking and currently smoke or have quit within the past 15 years. Fecal occult blood test (FOBT) of the stool. You may have this test every year starting at age 36. Flexible sigmoidoscopy or colonoscopy. You may have a sigmoidoscopy every 5 years or a colonoscopy every 10 years starting at age 50. Prostate cancer screening. Recommendations will vary depending on your family history and other risks. Hepatitis C blood test. Hepatitis B blood test. Sexually transmitted disease (STD) testing. Diabetes screening. This is done by checking your blood sugar (glucose) after you have not eaten for a while (fasting). You may have this done every 1-3 years. Abdominal aortic aneurysm (AAA) screening. You may need this if you are a current or former smoker. Osteoporosis. You may be screened starting at age 88 if you are at high risk. Talk with your health care provider about your test results, treatment options, and if necessary, the need for more tests. Vaccines  Your health care provider may recommend certain vaccines, such as: Influenza vaccine. This is recommended every year. Tetanus, diphtheria, and acellular pertussis (Tdap, Td) vaccine. You may need a Td booster every 10 years. Zoster vaccine. You may need this after age 48. Pneumococcal 13-valent conjugate (PCV13) vaccine. One dose is recommended after age 76. Pneumococcal polysaccharide (PPSV23) vaccine. One dose is recommended after age 46. Talk to your health care provider about which screenings and vaccines you need and how often you need  them. This information is not intended to replace  advice given to you by your health care provider. Make sure you discuss any questions you have with your health care provider. Document Released: 01/26/2015 Document Revised: 09/19/2015 Document Reviewed: 10/31/2014 Elsevier Interactive Patient Education  2017 [place] Prevention in the Home Falls can cause injuries. They can happen to people of all ages. There are many things you can do to make your home safe and to help prevent falls. What can I do on the outside of my home? Regularly fix the edges of walkways and driveways and fix any cracks. Remove anything that might make you trip as you walk through a door, such as a raised step or threshold. Trim any bushes or trees on the path to your home. Use bright outdoor lighting. Clear any walking paths of anything that might make someone trip, such as rocks or tools. Regularly check to see if handrails are loose or broken. Make sure that both sides of any steps have handrails. Any raised decks and porches should have guardrails on the edges. Have any leaves, snow, or ice cleared regularly. Use sand or salt on walking paths during winter. Clean up any spills in your garage right away. This includes oil or grease spills. What can I do in the bathroom? Use night lights. Install grab bars by the toilet and in the tub and shower. Do not use towel bars as grab bars. Use non-skid mats or decals in the tub or shower. If you need to sit down in the shower, use a plastic, non-slip stool. Keep the floor dry. Clean up any water that spills on the floor as soon as it happens. Remove soap buildup in the tub or shower regularly. Attach bath mats securely with double-sided non-slip rug tape. Do not have throw rugs and other things on the floor that can make you trip. What can I do in the bedroom? Use night lights. Make sure that you have a light by your bed that is easy to reach. Do not use any sheets or blankets that are too big for your bed.  They should not hang down onto the floor. Have a firm chair that has side arms. You can use this for support while you get dressed. Do not have throw rugs and other things on the floor that can make you trip. What can I do in the kitchen? Clean up any spills right away. Avoid walking on wet floors. Keep items that you use a lot in easy-to-reach places. If you need to reach something above you, use a strong step stool that has a grab bar. Keep electrical cords out of the way. Do not use floor polish or wax that makes floors slippery. If you must use wax, use non-skid floor wax. Do not have throw rugs and other things on the floor that can make you trip. What can I do with my stairs? Do not leave any items on the stairs. Make sure that there are handrails on both sides of the stairs and use them. Fix handrails that are broken or loose. Make sure that handrails are as long as the stairways. Check any carpeting to make sure that it is firmly attached to the stairs. Fix any carpet that is loose or worn. Avoid having throw rugs at the top or bottom of the stairs. If you do have throw rugs, attach them to the floor with carpet tape. Make sure that you have a light switch at  the top of the stairs and the bottom of the stairs. If you do not have them, ask someone to add them for you. What else can I do to help prevent falls? Wear shoes that: Do not have high heels. Have rubber bottoms. Are comfortable and fit you well. Are closed at the toe. Do not wear sandals. If you use a stepladder: Make sure that it is fully opened. Do not climb a closed stepladder. Make sure that both sides of the stepladder are locked into place. Ask someone to hold it for you, if possible. Clearly mark and make sure that you can see: Any grab bars or handrails. First and last steps. Where the edge of each step is. Use tools that help you move around (mobility aids) if they are needed. These  include: Canes. Walkers. Scooters. Crutches. Turn on the lights when you go into a dark area. Replace any light bulbs as soon as they burn out. Set up your furniture so you have a clear path. Avoid moving your furniture around. If any of your floors are uneven, fix them. If there are any pets around you, be aware of where they are. Review your medicines with your doctor. Some medicines can make you feel dizzy. This can increase your chance of falling. Ask your doctor what other things that you can do to help prevent falls. This information is not intended to replace advice given to you by your health care provider. Make sure you discuss any questions you have with your health care provider. Document Released: 10/26/2008 Document Revised: 06/07/2015 Document Reviewed: 02/03/2014 Elsevier Interactive Patient Education  2017 [employer].

## 2021-03-19 NOTE — Progress Notes (Signed)
Subjective:   Robert Zuniga is a 66 y.o. male who presents for an Initial Medicare Annual Wellness Visit.  I connected with  Robert Zuniga on 03/19/21 by a telephone enabled telemedicine application and verified that I am speaking with the correct person using two identifiers.   I discussed the limitations of evaluation and management by telemedicine. The patient expressed understanding and agreed to proceed.  Patient location: home  Provider location: Tele-Health not in office    Review of Systems     Cardiac Risk Factors include: advanced age (>71mn, >>35women);hypertension;male gender     Objective:    Today's Vitals   There is no height or weight on file to calculate BMI.  Advanced Directives 01/19/2020 02/14/2016 02/14/2016  Does Patient Have a Medical Advance Directive? No No No  Would patient like information on creating a medical advance directive? No - Patient declined No - Patient declined No - Patient declined    Current Medications (verified) Outpatient Encounter Medications as of 03/19/2021  Medication Sig   aspirin EC 81 MG tablet Take 81 mg by mouth daily.   betamethasone dipropionate 0.05 % lotion APPLY TO AFFECTED AREA(S) AS DIRECTED DO NOT USE FOR MORE THAN 2 WEEKS IN THE SAME AREA   calcium & magnesium carbonates (MYLANTA) 3338-250MG tablet Take 1 tablet by mouth daily.   clopidogrel (PLAVIX) 75 MG tablet Take 1 tablet (75 mg total) by mouth daily.   enalapril (VASOTEC) 10 MG tablet Take 1 tablet (10 mg total) by mouth daily.   finasteride (PROSCAR) 5 MG tablet Take 1 tablet (5 mg total) by mouth daily.   hydrochlorothiazide (HYDRODIURIL) 12.5 MG tablet Take 1 tablet (12.5 mg total) by mouth daily.   ketoconazole (NIZORAL) 2 % shampoo USE 1 APPLICATION TOPICALLY TWICE A WEEK. Strength: 2 %   metoprolol tartrate (LOPRESSOR) 25 MG tablet Take 1 tablet (25 mg total) by mouth daily.   simvastatin (ZOCOR) 40 MG tablet Take 1 tablet (40 mg total) by mouth daily.    tamsulosin (FLOMAX) 0.4 MG CAPS capsule Take 1 capsule (0.4 mg total) by mouth daily.   No facility-administered encounter medications on file as of 03/19/2021.    Allergies (verified) Morphine, Haemophilus influenzae, Influenza vaccine recombinant, Levaquin [levofloxacin in d5w], Niaspan [niacin er], and Pneumovax [pneumococcal polysaccharide vaccine]   History: Past Medical History:  Diagnosis Date   Bladder hemorrhage 2015   CAD (coronary artery disease)    Hyperlipidemia    Hypertension    MI (myocardial infarction) ([facility]    X3   Psoriasis    Past Surgical History:  Procedure Laterality Date   CORONARY ANGIOPLASTY WITH STENT PLACEMENT     CYSTOSTOMY W/ BLADDER BIOPSY     JOINT REPLACEMENT Bilateral    hips   PROSTATE SURGERY  2015   polyps -resolved   Family History  Problem Relation Age of Onset   Diabetes Mother    Dementia Mother    Heart disease Father    Cancer Sister    Liver disease Sister    Heart disease Maternal Grandfather    Heart disease Paternal Grandfather    Social History   Socioeconomic History   Marital status: Married    Spouse name: Not on file   Number of children: Not on file   Years of education: Not on file   Highest education level: Not on file  Occupational History   Not on file  Tobacco Use   Smoking status: Former  Packs/day: 1.00    Years: 2.00    Pack years: 2.00    Types: Cigarettes    Quit date: 09/12/1987    Years since quitting: 33.5   Smokeless tobacco: Never  Substance and Sexual Activity   Alcohol use: No    Alcohol/week: 0.0 standard drinks   Drug use: No   Sexual activity: Not on file  Other Topics Concern   Not on file  Social History Narrative   Not on file   Social Determinants of Health   Financial Resource Strain: Low Risk    Difficulty of Paying Living Expenses: Not hard at all  Food Insecurity: No Food Insecurity   Worried About Charity fundraiser in the Last Year: Never true   Ran Out of  Food in the Last Year: Never true  Transportation Needs: No Transportation Needs   Lack of Transportation (Medical): No   Lack of Transportation (Non-Medical): No  Physical Activity: Sufficiently Active   Days of Exercise per Week: 4 days   Minutes of Exercise per Session: 40 min  Stress: No Stress Concern Present   Feeling of Stress : Not at all  Social Connections: Moderately Integrated   Frequency of Communication with Friends and Family: More than three times a week   Frequency of Social Gatherings with Friends and Family: Once a week   Attends Religious Services: More than 4 times per year   Active Member of [employer] or Organizations: No   Attends Music therapist: Never   Marital Status: Married    Tobacco Counseling Counseling given: Not Answered   Clinical Intake:  Pre-visit preparation completed: Yes  Pain : No/denies pain     Diabetes: No  How often do you need to have someone help you when you read instructions, pamphlets, or other written materials from your doctor or pharmacy?: 1 - Never  Diabetic?  no  Interpreter Needed?: No  Information entered by :: Robert Kennedy LPN   Activities of Daily Living In your present state of health, do you have any difficulty performing the following activities: 03/19/2021 03/19/2021  Hearing? N N  Vision? N N  Difficulty concentrating or making decisions? N N  Walking or climbing stairs? N N  Dressing or bathing? N N  Doing errands, shopping? N N  Preparing Food and eating ? N N  Using the Toilet? N N  In the past six months, have you accidently leaked urine? N N  Do you have problems with loss of bowel control? N N  Managing your Medications? N N  Managing your Finances? N N  Housekeeping or managing your Housekeeping? N N  Some recent data might be hidden    Patient Care Team: Jon Billings, NP as PCP - General  Indicate any recent Medical Services you may have received from other than [facility]  providers in the past year (date may be approximate).     Assessment:   This is a routine wellness examination for Robert Zuniga.  Hearing/Vision screen Hearing Screening - Comments:: No trouble hearing Vision Screening - Comments:: Up to date [place]    [facility]  Dietary issues and exercise activities discussed: Current Exercise Habits: Home exercise routine, Type of exercise: strength training/weights;treadmill, Time (Minutes): 35, Frequency (Times/Week): 4, Weekly Exercise (Minutes/Week): 140, Intensity: Mild   Goals Addressed             This Visit's Progress    Increase physical activity       Water intake Cut  down on sugar       Depression Screen PHQ 2/9 Scores 02/11/2021 08/09/2020 11/04/2019 11/04/2019 10/27/2018 06/17/2017 05/05/2016  PHQ - 2 Score 0 0 0 0 0 0 0  PHQ- 9 Score 0 - - 0 0 - -    Fall Risk Fall Risk  03/19/2021 02/11/2021 08/09/2020 11/04/2019 04/27/2019  Falls in the past year? 0 0 0 0 0  Number falls in past yr: 0 0 0 0 0  Injury with Fall? 0 0 0 0 0  Risk for fall due to : - No Fall Risks No Fall Risks No Fall Risks -  Follow up Falls evaluation completed;Falls prevention discussed;Education provided Falls evaluation completed Falls evaluation completed Falls evaluation completed -    FALL RISK PREVENTION PERTAINING TO THE HOME:  Any stairs in or around the home? Yes  If so, are there any without handrails? No  Home free of loose throw rugs in walkways, pet beds, electrical cords, etc? Yes  Adequate lighting in your home to reduce risk of falls? Yes   ASSISTIVE DEVICES UTILIZED TO PREVENT FALLS:  Life alert? No  Use of a cane, walker or w/c? No  Grab bars in the bathroom? Yes  Shower chair or bench in shower? Yes  Elevated toilet seat or a handicapped toilet? Yes   TIMED UP AND GO:  Was the test performed? No .   Cognitive Function:  Normal cognitive status assessed by direct observation by this Nurse Health Advisor. No abnormalities found.           Immunizations Immunization History  Administered Date(s) Administered   Td 07/01/2007, 11/04/2019    TDAP status: Up to date  Flu Vaccine status: Declined, Education has been provided regarding the importance of this vaccine but patient still declined. Advised may receive this vaccine at local pharmacy or Health Dept. Aware to provide a copy of the vaccination record if obtained from local pharmacy or Health Dept. Verbalized acceptance and understanding.  Pneumococcal vaccine status: Declined,  Education has been provided regarding the importance of this vaccine but patient still declined. Advised may receive this vaccine at local pharmacy or Health Dept. Aware to provide a copy of the vaccination record if obtained from local pharmacy or Health Dept. Verbalized acceptance and understanding.   Covid-19 vaccine status: Declined, Education has been provided regarding the importance of this vaccine but patient still declined. Advised may receive this vaccine at local pharmacy or Health Dept.or vaccine clinic. Aware to provide a copy of the vaccination record if obtained from local pharmacy or Health Dept. Verbalized acceptance and understanding.  Qualifies for Shingles Vaccine?  Adverse reaction no vaccination   Zostavax completed No   Shingrix Completed?: No.    Education has been provided regarding the importance of this vaccine. Patient has been advised to call insurance company to determine out of pocket expense if they have not yet received this vaccine. Advised may also receive vaccine at local pharmacy or Health Dept. Verbalized acceptance and understanding.  Screening Tests Health Maintenance  Topic Date Due   COVID-19 Vaccine (1) 04/04/2021 (Originally 10/17/1955)   Zoster Vaccines- Shingrix (1 of 2) 05/12/2021 (Originally 04/16/1974)   COLONOSCOPY (Pts 45-60yr Insurance coverage will need to be confirmed)  08/09/2021 (Originally 04/17/1999)   TETANUS/TDAP  11/03/2029   Hepatitis C  Screening  Completed   HPV VACCINES  Aged Out   Pneumonia Vaccine 66 Years old  Discontinued    Health Maintenance  There are no preventive care reminders  to display for this patient.   Colorectal cancer screening: Referral to GI placed Cologuard ordered by MD. Pt aware the office will call re: appt.  Lung Cancer Screening: (Low Dose CT Chest recommended if Age 57-80 years, 30 pack-year currently smoking OR have quit w/in 15years.) does not qualify.   Lung Cancer Screening Referral:   Additional Screening:  Hepatitis C Screening: does not qualify; Completed 2019  Vision Screening: Recommended annual ophthalmology exams for early detection of glaucoma and other disorders of the eye. Is the patient up to date with their annual eye exam?  Yes  Who is the provider or what is the name of the office in which the patient attends annual eye exams? [facility] If pt is not established with a provider, would they like to be referred to a provider to establish care? No .   Dental Screening: Recommended annual dental exams for proper oral hygiene  Community Resource Referral / Chronic Care Management: CRR required this visit?  No   CCM required this visit?  No      Plan:     I have personally reviewed and noted the following in the patients chart:   Medical and social history Use of alcohol, tobacco or illicit drugs  Current medications and supplements including opioid prescriptions. Patient is not currently taking opioid prescriptions. Functional ability and status Nutritional status Physical activity Advanced directives List of other physicians Hospitalizations, surgeries, and ER visits in previous 12 months Vitals Screenings to include cognitive, depression, and falls Referrals and appointments  In addition, I have reviewed and discussed with patient certain preventive protocols, quality metrics, and best practice recommendations. A written personalized care plan for preventive  services as well as general preventive health recommendations were provided to patient.     Robert Kennedy, LPN   04/15/3534   Nurse Notes:

## 2021-03-28 DIAGNOSIS — Z955 Presence of coronary angioplasty implant and graft: Secondary | ICD-10-CM | POA: Diagnosis not present

## 2021-03-28 DIAGNOSIS — I10 Essential (primary) hypertension: Secondary | ICD-10-CM | POA: Diagnosis not present

## 2021-03-28 DIAGNOSIS — E785 Hyperlipidemia, unspecified: Secondary | ICD-10-CM | POA: Diagnosis not present

## 2021-03-28 DIAGNOSIS — I2510 Atherosclerotic heart disease of native coronary artery without angina pectoris: Secondary | ICD-10-CM | POA: Diagnosis not present

## 2021-04-10 DIAGNOSIS — I2510 Atherosclerotic heart disease of native coronary artery without angina pectoris: Secondary | ICD-10-CM | POA: Diagnosis not present

## 2021-04-17 DIAGNOSIS — I10 Essential (primary) hypertension: Secondary | ICD-10-CM | POA: Diagnosis not present

## 2021-04-17 DIAGNOSIS — Z955 Presence of coronary angioplasty implant and graft: Secondary | ICD-10-CM | POA: Diagnosis not present

## 2021-04-17 DIAGNOSIS — I2510 Atherosclerotic heart disease of native coronary artery without angina pectoris: Secondary | ICD-10-CM | POA: Diagnosis not present

## 2021-07-02 ENCOUNTER — Ambulatory Visit: Payer: Self-pay | Admitting: *Deleted

## 2021-07-02 NOTE — Telephone Encounter (Signed)
Summary: middle finger on right hand pains when it jumps out of place   Patient called in states middle finger on right hand has bad pain, every time it jumps out of place. It has been going on for about a month. He has to keep it taped down so it won't jump out of place. Please call back for further assistance.      Chief Complaint: finger popping out of joint Symptoms: painful Frequency: frequent if not bandanged to other fingers Pertinent Negatives: Patient denies infection, fever, redness, swelling Disposition: '[]'$ ED /'[]'$ Urgent Care (no appt availability in office) / '[x]'$ Appointment(In office/virtual)/ '[]'$  [facility]/ '[]'$ Home Care/ '[]'$ Refused Recommended Disposition /'[]'$ [facility]/ '[]'$  Follow-up with PCP Additional Notes: Pt needs to see PCP for referral to hand center. He will bring a list of who his insurance will cover. Finger is completely popping out of joint and he is putting it back into place. Appt tomorrow.  Reason for Disposition  [1] MODERATE pain (e.g., interferes with normal activities) AND [2] present > 3 days  Answer Assessment - Initial Assessment Questions 1. ONSET: "When did the pain start?"      A month 2. LOCATION and RADIATION: "Where is the pain located?"  (e.g., fingertip, around nail, joint, entire  finger)      joint 3. SEVERITY: "How bad is the pain?" "What does it keep you from doing?"   (Scale 1-10; or mild, moderate, severe)  - MILD (1-3): doesn't interfere with normal activities.   - MODERATE (4-7): interferes with normal activities or awakens from sleep.  - SEVERE (8-10): excruciating pain, unable to hold a glass of water or bend finger even a little.     Really bad pain when it pops out. 4. APPEARANCE: "What does the finger look like?" (e.g., redness, swelling, bruising, pallor)     Has hard knot under it 5. WORK OR EXERCISE: "Has there been any recent work or exercise that involved this part (i.e., fingers or hand) of the body?"      no 6. CAUSE: "What do you think is causing the pain?"     Out of joint 7. AGGRAVATING FACTORS: "What makes the pain worse?" (e.g., using computer)     When it pops out of place 8. OTHER SYMPTOMS: "Do you have any other symptoms?" (e.g., fever, neck pain, numbness)     no 9. PREGNANCY: "Is there any chance you are pregnant?" "When was your last menstrual period?"     na  Protocols used: Finger Pain-A-AH

## 2021-07-03 ENCOUNTER — Ambulatory Visit (INDEPENDENT_AMBULATORY_CARE_PROVIDER_SITE_OTHER): Payer: No Typology Code available for payment source | Admitting: Internal Medicine

## 2021-07-03 ENCOUNTER — Encounter: Payer: Self-pay | Admitting: Internal Medicine

## 2021-07-03 VITALS — BP 128/78 | HR 67 | Temp 98.2°F | Ht 68.31 in | Wt 186.2 lb

## 2021-07-03 DIAGNOSIS — M6530 Trigger finger, unspecified finger: Secondary | ICD-10-CM | POA: Diagnosis not present

## 2021-07-03 NOTE — Progress Notes (Signed)
BP 128/78   Pulse 67   Temp 98.2 F (36.8 C) (Oral)   Ht 5' 8.31" (1.735 m)   Wt 186 lb 3.2 oz (84.5 kg)   SpO2 96%   BMI 28.06 kg/m    Subjective:    Patient ID: Robert Zuniga, male    DOB: December 19, 1955, 66 y.o.   MRN: [id number]  Chief Complaint  Patient presents with  . Joint Pain    Joint pain in Right middle finger at base. Has been for past month. Patient states that he keeps finger wrapped so it will not pop out of joint Patient needs referral to Ortho     HPI: Robert Zuniga is a 66 y.o. male  Patient complains of pain in his  Hand Pain  The incident occurred 5 to 7 days ago. The incident occurred at home. The quality of the pain is described as aching. The pain has been Intermittent since the incident. The treatment provided mild relief.    Chief Complaint  Patient presents with  . Joint Pain    Joint pain in Right middle finger at base. Has been for past month. Patient states that he keeps finger wrapped so it will not pop out of joint Patient needs referral to Ortho     Relevant past medical, surgical, family and social history reviewed and updated as indicated. Interim medical history since our last visit reviewed. Allergies and medications reviewed and updated.  Review of Systems  Per HPI unless specifically indicated above     Objective:    BP 128/78   Pulse 67   Temp 98.2 F (36.8 C) (Oral)   Ht 5' 8.31" (1.735 m)   Wt 186 lb 3.2 oz (84.5 kg)   SpO2 96%   BMI 28.06 kg/m   Wt Readings from Last 3 Encounters:  07/03/21 186 lb 3.2 oz (84.5 kg)  02/11/21 183 lb (83 kg)  08/09/20 175 lb 8 oz (79.6 kg)    Physical Exam Vitals and nursing note reviewed.  Constitutional:      General: He is not in acute distress.    Appearance: Normal appearance. He is not ill-appearing or diaphoretic.  HENT:     Mouth/Throat:     Pharynx: No oropharyngeal exudate or posterior oropharyngeal erythema.  Eyes:     Conjunctiva/sclera: Conjunctivae normal.      Pupils: Pupils are equal, round, and reactive to light.  Abdominal:     General: Abdomen is flat. Bowel sounds are normal.     Palpations: Abdomen is soft. There is no mass.     Tenderness: There is no abdominal tenderness.  Musculoskeletal:        General: Tenderness and deformity present.     Cervical back: Normal range of motion and neck supple. No rigidity or tenderness.  Neurological:     Mental Status: He is alert.    Results for orders placed or performed in visit on 02/11/21  Microscopic Examination   Urine  Result Value Ref Range   WBC, UA 0-5 0 - 5 /hpf   RBC 0-2 0 - 2 /hpf   Epithelial Cells (non renal) 0-10 0 - 10 /hpf   Mucus, UA Present (A) Not Estab.   Bacteria, UA Few (A) None seen/Few  HgB A1c  Result Value Ref Range   Hgb A1c MFr Bld 6.3 (H) 4.8 - 5.6 %   Est. average glucose Bld gHb Est-mCnc 134 mg/dL  TSH  Result Value Ref Range   TSH  1.080 0.450 - 4.500 uIU/mL  PSA  Result Value Ref Range   Prostate Specific Ag, Serum 0.9 0.0 - 4.0 ng/mL  Lipid panel  Result Value Ref Range   Cholesterol, Total 148 100 - 199 mg/dL   Triglycerides 98 0 - 149 mg/dL   HDL 44 >39 mg/dL   VLDL Cholesterol Cal 18 5 - 40 mg/dL   LDL Chol Calc (NIH) 86 0 - 99 mg/dL   Chol/HDL Ratio 3.4 0.0 - 5.0 ratio  CBC with Differential/Platelet  Result Value Ref Range   WBC 4.4 3.4 - 10.8 x10E3/uL   RBC 5.91 (H) 4.14 - 5.80 x10E6/uL   Hemoglobin 17.7 13.0 - 17.7 g/dL   Hematocrit 50.8 37.5 - 51.0 %   MCV 86 79 - 97 fL   MCH 29.9 26.6 - 33.0 pg   MCHC 34.8 31.5 - 35.7 g/dL   RDW 12.8 11.6 - 15.4 %   Platelets 152 150 - 450 x10E3/uL   Neutrophils 64 Not Estab. %   Lymphs 21 Not Estab. %   Monocytes 9 Not Estab. %   Eos 5 Not Estab. %   Basos 1 Not Estab. %   Neutrophils Absolute 2.9 1.4 - 7.0 x10E3/uL   Lymphocytes Absolute 0.9 0.7 - 3.1 x10E3/uL   Monocytes Absolute 0.4 0.1 - 0.9 x10E3/uL   EOS (ABSOLUTE) 0.2 0.0 - 0.4 x10E3/uL   Basophils Absolute 0.0 0.0 - 0.2 x10E3/uL    Immature Granulocytes 0 Not Estab. %   Immature Grans (Abs) 0.0 0.0 - 0.1 x10E3/uL  Comprehensive metabolic panel  Result Value Ref Range   Glucose 121 (H) 70 - 99 mg/dL   BUN 17 8 - 27 mg/dL   Creatinine, Ser 0.79 0.76 - 1.27 mg/dL   eGFR 98 >59 mL/min/1.73   BUN/Creatinine Ratio 22 10 - 24   Sodium 144 134 - 144 mmol/L   Potassium 4.0 3.5 - 5.2 mmol/L   Chloride 106 96 - 106 mmol/L   CO2 27 20 - 29 mmol/L   Calcium 9.7 8.6 - 10.2 mg/dL   Total Protein 6.8 6.0 - 8.5 g/dL   Albumin 4.5 3.8 - 4.8 g/dL   Globulin, Total 2.3 1.5 - 4.5 g/dL   Albumin/Globulin Ratio 2.0 1.2 - 2.2   Bilirubin Total 0.8 0.0 - 1.2 mg/dL   Alkaline Phosphatase 118 44 - 121 IU/L   AST 23 0 - 40 IU/L   ALT 24 0 - 44 IU/L  Urinalysis, Routine w reflex microscopic  Result Value Ref Range   Specific Gravity, UA >1.030 (H) 1.005 - 1.030   pH, UA 5.5 5.0 - 7.5   Color, UA Yellow Yellow   Appearance Ur Clear Clear   Leukocytes,UA Negative Negative   Protein,UA 1+ (A) Negative/Trace   Glucose, UA 1+ (A) Negative   Ketones, UA Trace (A) Negative   RBC, UA 2+ (A) Negative   Bilirubin, UA Negative Negative   Urobilinogen, Ur 1.0 0.2 - 1.0 mg/dL   Nitrite, UA Negative Negative   Microscopic Examination See below:         Current Outpatient Medications:  .  aspirin EC 81 MG tablet, Take 81 mg by mouth daily., Disp: , Rfl:  .  betamethasone dipropionate 0.05 % lotion, APPLY TO AFFECTED AREA(S) AS DIRECTED DO NOT USE FOR MORE THAN 2 WEEKS IN THE SAME AREA, Disp: 60 mL, Rfl: 0 .  calcium & magnesium carbonates (MYLANTA) 902-409 MG tablet, Take 1 tablet by mouth daily., Disp: , Rfl:  .  clopidogrel (PLAVIX) 75 MG tablet, Take 1 tablet (75 mg total) by mouth daily., Disp: 90 tablet, Rfl: 3 .  enalapril (VASOTEC) 10 MG tablet, Take 1 tablet (10 mg total) by mouth daily., Disp: 90 tablet, Rfl: 3 .  finasteride (PROSCAR) 5 MG tablet, Take 1 tablet (5 mg total) by mouth daily., Disp: 90 tablet, Rfl: 1 .   hydrochlorothiazide (HYDRODIURIL) 12.5 MG tablet, Take 1 tablet (12.5 mg total) by mouth daily., Disp: 90 tablet, Rfl: 3 .  ketoconazole (NIZORAL) 2 % shampoo, USE 1 APPLICATION TOPICALLY TWICE A WEEK. Strength: 2 %, Disp: 120 mL, Rfl: 3 .  metoprolol tartrate (LOPRESSOR) 25 MG tablet, Take 1 tablet (25 mg total) by mouth daily., Disp: 90 tablet, Rfl: 3 .  simvastatin (ZOCOR) 40 MG tablet, Take 1 tablet (40 mg total) by mouth daily., Disp: 90 tablet, Rfl: 3 .  tamsulosin (FLOMAX) 0.4 MG CAPS capsule, Take 1 capsule (0.4 mg total) by mouth daily., Disp: 90 capsule, Rfl: 3    Assessment & Plan:  Right hand trigger finger : Will need to see orthopedics for such  Pt would like to see ortho in [place].  Needed this referal per his insurance.  Problem List Items Addressed This Visit       Musculoskeletal and Integument   Trigger finger of right hand - Primary   Relevant Orders   Ambulatory referral to Orthopedics     Orders Placed This Encounter  Procedures  . Ambulatory referral to Orthopedics     No orders of the defined types were placed in this encounter.    Follow up plan: No follow-ups on file.

## 2021-07-04 ENCOUNTER — Telehealth: Payer: Self-pay | Admitting: Nurse Practitioner

## 2021-07-04 NOTE — Telephone Encounter (Signed)
Routing to referral coordinator.

## 2021-07-04 NOTE — Telephone Encounter (Signed)
Pts wife is calling to request if the Referral could be sent to [facility] as well to Dr. Daryll Brod, MD. Pt states that Dr. Fredna Dow is retiring on Wednesday Pt will be seeing Dr. Shelbie Hutching. Fax [phone] [employer]

## 2021-07-18 DIAGNOSIS — M65331 Trigger finger, right middle finger: Secondary | ICD-10-CM | POA: Diagnosis not present

## 2021-08-08 NOTE — Progress Notes (Signed)
BP 123/76   Pulse 62   Temp 99.1 F (37.3 C) (Oral)   Wt 181 lb 6.4 oz (82.3 kg)   SpO2 99%   BMI 27.33 kg/m    Subjective:    Patient ID: [name], male    DOB: 10-15-1955, 66 y.o.   MRN: [id number]  HPI: Nareg Breighner is a 66 y.o. male  Chief Complaint  Patient presents with   Hyperlipidemia    6 month follow up    Hypertension   Diabetes   HYPERTENSION / HYPERLIPIDEMIA Satisfied with current treatment? no Duration of hypertension: years BP monitoring frequency: not checking BP range:  BP medication side effects: no Past BP meds:  Metoprolol and enalapril and HCTZ Duration of hyperlipidemia: years Cholesterol medication side effects: no Cholesterol supplements: none Past cholesterol medications: simvastatin (zocor) Medication compliance: excellent compliance Aspirin: yes Recent stressors: no Recurrent headaches: no Visual changes: no Palpitations: no Dyspnea: no Chest pain: no Lower extremity edema: no Dizzy/lightheaded: no  BPH BPH status: controlled Satisfied with current treatment?: yes Medication side effects: no Medication compliance: excellent compliance Duration: years Nocturia: no Urinary frequency:no Incomplete voiding: no Urgency: no Weak urinary stream: no Straining to start stream: no Dysuria: no Onset: gradual Severity: severe  Will have a trigger finger release with Ortho in August.   Relevant past medical, surgical, family and social history reviewed and updated as indicated. Interim medical history since our last visit reviewed. Allergies and medications reviewed and updated.  Review of Systems  Eyes:  Negative for visual disturbance.  Respiratory:  Negative for chest tightness and shortness of breath.   Cardiovascular:  Negative for chest pain, palpitations and leg swelling.  Musculoskeletal:        Trigger finger  Neurological:  Negative for dizziness, light-headedness, numbness and headaches.    Per HPI unless  specifically indicated above     Objective:    BP 123/76   Pulse 62   Temp 99.1 F (37.3 C) (Oral)   Wt 181 lb 6.4 oz (82.3 kg)   SpO2 99%   BMI 27.33 kg/m   Wt Readings from Last 3 Encounters:  08/12/21 181 lb 6.4 oz (82.3 kg)  07/03/21 186 lb 3.2 oz (84.5 kg)  02/11/21 183 lb (83 kg)    Physical Exam Vitals and nursing note reviewed.  Constitutional:      General: He is not in acute distress.    Appearance: Normal appearance. He is normal weight. He is not ill-appearing, toxic-appearing or diaphoretic.  HENT:     Head: Normocephalic.     Right Ear: External ear normal.     Left Ear: External ear normal.     Nose: Nose normal. No congestion or rhinorrhea.     Mouth/Throat:     Mouth: Mucous membranes are moist.  Eyes:     General:        Right eye: No discharge.        Left eye: No discharge.     Extraocular Movements: Extraocular movements intact.     Conjunctiva/sclera: Conjunctivae normal.     Pupils: Pupils are equal, round, and reactive to light.  Cardiovascular:     Rate and Rhythm: Normal rate and regular rhythm.     Heart sounds: No murmur heard. Pulmonary:     Effort: Pulmonary effort is normal. No respiratory distress.     Breath sounds: Normal breath sounds. No wheezing, rhonchi or rales.  Abdominal:     General: Abdomen is flat. Bowel  sounds are normal.  Musculoskeletal:     Cervical back: Normal range of motion and neck supple.  Skin:    General: Skin is warm and dry.     Capillary Refill: Capillary refill takes less than 2 seconds.  Neurological:     General: No focal deficit present.     Mental Status: He is alert and oriented to person, place, and time.  Psychiatric:        Mood and Affect: Mood normal.        Behavior: Behavior normal.        Thought Content: Thought content normal.        Judgment: Judgment normal.     Results for orders placed or performed in visit on 02/11/21  Microscopic Examination   Urine  Result Value Ref Range    WBC, UA 0-5 0 - 5 /hpf   RBC, Urine 0-2 0 - 2 /hpf   Epithelial Cells (non renal) 0-10 0 - 10 /hpf   Mucus, UA Present (A) Not Estab.   Bacteria, UA Few (A) None seen/Few  HgB A1c  Result Value Ref Range   Hgb A1c MFr Bld 6.3 (H) 4.8 - 5.6 %   Est. average glucose Bld gHb Est-mCnc 134 mg/dL  TSH  Result Value Ref Range   TSH 1.080 0.450 - 4.500 uIU/mL  PSA  Result Value Ref Range   Prostate Specific Ag, Serum 0.9 0.0 - 4.0 ng/mL  Lipid panel  Result Value Ref Range   Cholesterol, Total 148 100 - 199 mg/dL   Triglycerides 98 0 - 149 mg/dL   HDL 44 >39 mg/dL   VLDL Cholesterol Cal 18 5 - 40 mg/dL   LDL Chol Calc (NIH) 86 0 - 99 mg/dL   Chol/HDL Ratio 3.4 0.0 - 5.0 ratio  CBC with Differential/Platelet  Result Value Ref Range   WBC 4.4 3.4 - 10.8 x10E3/uL   RBC 5.91 (H) 4.14 - 5.80 x10E6/uL   Hemoglobin 17.7 13.0 - 17.7 g/dL   Hematocrit 50.8 37.5 - 51.0 %   MCV 86 79 - 97 fL   MCH 29.9 26.6 - 33.0 pg   MCHC 34.8 31.5 - 35.7 g/dL   RDW 12.8 11.6 - 15.4 %   Platelets 152 150 - 450 x10E3/uL   Neutrophils 64 Not Estab. %   Lymphs 21 Not Estab. %   Monocytes 9 Not Estab. %   Eos 5 Not Estab. %   Basos 1 Not Estab. %   Neutrophils Absolute 2.9 1.4 - 7.0 x10E3/uL   Lymphocytes Absolute 0.9 0.7 - 3.1 x10E3/uL   Monocytes Absolute 0.4 0.1 - 0.9 x10E3/uL   EOS (ABSOLUTE) 0.2 0.0 - 0.4 x10E3/uL   Basophils Absolute 0.0 0.0 - 0.2 x10E3/uL   Immature Granulocytes 0 Not Estab. %   Immature Grans (Abs) 0.0 0.0 - 0.1 x10E3/uL  Comprehensive metabolic panel  Result Value Ref Range   Glucose 121 (H) 70 - 99 mg/dL   BUN 17 8 - 27 mg/dL   Creatinine, Ser 0.79 0.76 - 1.27 mg/dL   eGFR 98 >59 mL/min/1.73   BUN/Creatinine Ratio 22 10 - 24   Sodium 144 134 - 144 mmol/L   Potassium 4.0 3.5 - 5.2 mmol/L   Chloride 106 96 - 106 mmol/L   CO2 27 20 - 29 mmol/L   Calcium 9.7 8.6 - 10.2 mg/dL   Total Protein 6.8 6.0 - 8.5 g/dL   Albumin 4.5 3.8 - 4.8 g/dL   Globulin, Total  2.3 1.5 - 4.5  g/dL   Albumin/Globulin Ratio 2.0 1.2 - 2.2   Bilirubin Total 0.8 0.0 - 1.2 mg/dL   Alkaline Phosphatase 118 44 - 121 IU/L   AST 23 0 - 40 IU/L   ALT 24 0 - 44 IU/L  Urinalysis, Routine w reflex microscopic  Result Value Ref Range   Specific Gravity, UA >1.030 (H) 1.005 - 1.030   pH, UA 5.5 5.0 - 7.5   Color, UA Yellow Yellow   Appearance Ur Clear Clear   Leukocytes,UA Negative Negative   Protein,UA 1+ (A) Negative/Trace   Glucose, UA 1+ (A) Negative   Ketones, UA Trace (A) Negative   RBC, UA 2+ (A) Negative   Bilirubin, UA Negative Negative   Urobilinogen, Ur 1.0 0.2 - 1.0 mg/dL   Nitrite, UA Negative Negative   Microscopic Examination See below:       Assessment & Plan:   Problem List Items Addressed This Visit       Cardiovascular and Mediastinum   Hypertension    Chronic.  Controlled.  Continue with current medication regimen on Metoprolol 71m daily, Enalapril 151mdaily, and HCTZ 12.41m741maily.  Refills sent today.  Labs ordered today.  Return to clinic in 6 months for reevaluation.  Call sooner if concerns arise.         Relevant Medications   simvastatin (ZOCOR) 40 MG tablet   metoprolol tartrate (LOPRESSOR) 25 MG tablet   hydrochlorothiazide (HYDRODIURIL) 12.5 MG tablet   enalapril (VASOTEC) 10 MG tablet   Other Relevant Orders   Comp Met (CMET)   CAD (coronary artery disease)    Chronic.  Controlled.  Continue with current medication regimen of Simvastatin 83m57mily.  Refills sent today.  Labs ordered today.  Return to clinic in 6 months for reevaluation.  Call sooner if concerns arise.        Relevant Medications   simvastatin (ZOCOR) 40 MG tablet   metoprolol tartrate (LOPRESSOR) 25 MG tablet   hydrochlorothiazide (HYDRODIURIL) 12.5 MG tablet   enalapril (VASOTEC) 10 MG tablet   clopidogrel (PLAVIX) 75 MG tablet   Purpura senilis (HCC) - Primary    Chronic. Reassured patient.  Will continue to monitor.       Relevant Medications   simvastatin  (ZOCOR) 40 MG tablet   metoprolol tartrate (LOPRESSOR) 25 MG tablet   hydrochlorothiazide (HYDRODIURIL) 12.5 MG tablet   enalapril (VASOTEC) 10 MG tablet     Endocrine   IFG (impaired fasting glucose)    Labs ordered today. Will make recommendations based on lab results.      Relevant Orders   Lipid Profile     Genitourinary   BPH (benign prostatic hyperplasia)   Relevant Medications   tamsulosin (FLOMAX) 0.4 MG CAPS capsule   finasteride (PROSCAR) 5 MG tablet     Other   Hyperlipidemia    Chronic.  Controlled.  Continue with current medication regimen of Simvastatin daily.   Refills sent today.  Labs ordered today.  Return to clinic in 6 months for reevaluation.  Call sooner if concerns arise.        Relevant Medications   simvastatin (ZOCOR) 40 MG tablet   metoprolol tartrate (LOPRESSOR) 25 MG tablet   hydrochlorothiazide (HYDRODIURIL) 12.5 MG tablet   enalapril (VASOTEC) 10 MG tablet   Other Relevant Orders   HgB A1c     Follow up plan: Return in about 6 months (around 02/12/2022) for Physical and Fasting labs.

## 2021-08-12 ENCOUNTER — Encounter: Payer: Self-pay | Admitting: Nurse Practitioner

## 2021-08-12 ENCOUNTER — Ambulatory Visit (INDEPENDENT_AMBULATORY_CARE_PROVIDER_SITE_OTHER): Payer: No Typology Code available for payment source | Admitting: Nurse Practitioner

## 2021-08-12 VITALS — BP 123/76 | HR 62 | Temp 99.1°F | Wt 181.4 lb

## 2021-08-12 DIAGNOSIS — D692 Other nonthrombocytopenic purpura: Secondary | ICD-10-CM

## 2021-08-12 DIAGNOSIS — N400 Enlarged prostate without lower urinary tract symptoms: Secondary | ICD-10-CM | POA: Diagnosis not present

## 2021-08-12 DIAGNOSIS — E782 Mixed hyperlipidemia: Secondary | ICD-10-CM | POA: Diagnosis not present

## 2021-08-12 DIAGNOSIS — I2510 Atherosclerotic heart disease of native coronary artery without angina pectoris: Secondary | ICD-10-CM

## 2021-08-12 DIAGNOSIS — R7301 Impaired fasting glucose: Secondary | ICD-10-CM

## 2021-08-12 DIAGNOSIS — I2583 Coronary atherosclerosis due to lipid rich plaque: Secondary | ICD-10-CM | POA: Diagnosis not present

## 2021-08-12 DIAGNOSIS — I10 Essential (primary) hypertension: Secondary | ICD-10-CM

## 2021-08-12 MED ORDER — HYDROCHLOROTHIAZIDE 12.5 MG PO TABS: 12.5000 mg | ORAL_TABLET | Freq: Every day | ORAL | 3 refills | Status: DC

## 2021-08-12 MED ORDER — METOPROLOL TARTRATE 25 MG PO TABS: 25.0000 mg | ORAL_TABLET | Freq: Every day | ORAL | 3 refills | Status: DC

## 2021-08-12 MED ORDER — SIMVASTATIN 40 MG PO TABS: 40.0000 mg | ORAL_TABLET | Freq: Every day | ORAL | 3 refills | Status: DC

## 2021-08-12 MED ORDER — ENALAPRIL MALEATE 10 MG PO TABS: 10.0000 mg | ORAL_TABLET | Freq: Every day | ORAL | 3 refills | Status: DC

## 2021-08-12 MED ORDER — CLOPIDOGREL BISULFATE 75 MG PO TABS: 75.0000 mg | ORAL_TABLET | Freq: Every day | ORAL | 3 refills | Status: DC

## 2021-08-12 MED ORDER — FINASTERIDE 5 MG PO TABS: 5.0000 mg | ORAL_TABLET | Freq: Every day | ORAL | 3 refills | Status: DC

## 2021-08-12 MED ORDER — TAMSULOSIN HCL 0.4 MG PO CAPS
0.4000 mg | ORAL_CAPSULE | Freq: Every day | ORAL | 3 refills | Status: DC
Start: 2021-08-12 — End: 2022-08-19

## 2021-08-12 NOTE — Assessment & Plan Note (Signed)
Chronic. Reassured patient.  Will continue to monitor.  

## 2021-08-12 NOTE — Assessment & Plan Note (Signed)
Chronic.  Controlled.  Continue with current medication regimen of Simvastatin daily.   Refills sent today.  Labs ordered today.  Return to clinic in 6 months for reevaluation.  Call sooner if concerns arise.   

## 2021-08-12 NOTE — Assessment & Plan Note (Signed)
Labs ordered today.  Will make recommendations based on lab results. ?

## 2021-08-12 NOTE — Assessment & Plan Note (Signed)
Chronic.  Controlled.  Continue with current medication regimen on Metoprolol '25mg'$  daily, Enalapril '10mg'$  daily, and HCTZ 12.'5mg'$  daily.  Refills sent today.  Labs ordered today.  Return to clinic in 6 months for reevaluation.  Call sooner if concerns arise.

## 2021-08-12 NOTE — Assessment & Plan Note (Signed)
Chronic.  Controlled.  Continue with current medication regimen of Simvastatin '40mg'$  daily.  Refills sent today.  Labs ordered today.  Return to clinic in 6 months for reevaluation.  Call sooner if concerns arise.

## 2021-08-13 LAB — COMPREHENSIVE METABOLIC PANEL
ALT: 18 IU/L (ref 0–44)
AST: 23 IU/L (ref 0–40)
Albumin/Globulin Ratio: 1.9 (ref 1.2–2.2)
Albumin: 4.3 g/dL (ref 3.9–4.9)
Alkaline Phosphatase: 114 IU/L (ref 44–121)
BUN/Creatinine Ratio: 18 (ref 10–24)
BUN: 15 mg/dL (ref 8–27)
Bilirubin Total: 0.7 mg/dL (ref 0.0–1.2)
CO2: 22 mmol/L (ref 20–29)
Calcium: 9.4 mg/dL (ref 8.6–10.2)
Chloride: 106 mmol/L (ref 96–106)
Creatinine, Ser: 0.84 mg/dL (ref 0.76–1.27)
Globulin, Total: 2.3 g/dL (ref 1.5–4.5)
Glucose: 123 mg/dL — ABNORMAL HIGH (ref 70–99)
Potassium: 4 mmol/L (ref 3.5–5.2)
Sodium: 142 mmol/L (ref 134–144)
Total Protein: 6.6 g/dL (ref 6.0–8.5)
eGFR: 96 mL/min/{1.73_m2} (ref >59)

## 2021-08-13 LAB — LIPID PANEL
Chol/HDL Ratio: 3.1 ratio (ref 0.0–5.0)
Cholesterol, Total: 143 mg/dL (ref 100–199)
HDL: 46 mg/dL (ref >39)
LDL Chol Calc (NIH): 80 mg/dL (ref 0–99)
Triglycerides: 90 mg/dL (ref 0–149)
VLDL Cholesterol Cal: 17 mg/dL (ref 5–40)

## 2021-08-13 LAB — HEMOGLOBIN A1C
Est. average glucose Bld gHb Est-mCnc: 131 mg/dL
Hgb A1c MFr Bld: 6.2 % — ABNORMAL HIGH (ref 4.8–5.6)

## 2021-08-13 NOTE — Progress Notes (Signed)
Hi Mr. Filley. It was nice to see you yesterday.  Your lab work looks good.  Your A1c remains well controlled at 6.2.  Cholesterol looks great also.  No concerns at this time. Continue with your current medication regimen.  Follow up as discussed.  Please let me know if you have any questions.

## 2021-09-04 DIAGNOSIS — M65331 Trigger finger, right middle finger: Secondary | ICD-10-CM | POA: Diagnosis not present

## 2021-10-16 DIAGNOSIS — Z955 Presence of coronary angioplasty implant and graft: Secondary | ICD-10-CM | POA: Diagnosis not present

## 2021-10-16 DIAGNOSIS — R001 Bradycardia, unspecified: Secondary | ICD-10-CM | POA: Diagnosis not present

## 2021-10-16 DIAGNOSIS — I10 Essential (primary) hypertension: Secondary | ICD-10-CM | POA: Diagnosis not present

## 2021-10-16 DIAGNOSIS — I2510 Atherosclerotic heart disease of native coronary artery without angina pectoris: Secondary | ICD-10-CM | POA: Diagnosis not present

## 2021-11-15 ENCOUNTER — Other Ambulatory Visit: Payer: Self-pay

## 2021-11-15 NOTE — Telephone Encounter (Signed)
Patient requesting refill for betamethasone DP 0.05%. Last filled 05/16/2021 0 RF. Last office visit 08/12/2021. Next office visit 02/12/2022, please advise

## 2021-11-18 MED ORDER — BETAMETHASONE DIPROPIONATE 0.05 % EX LOTN: TOPICAL_LOTION | CUTANEOUS | 0 refills | Status: DC

## 2021-11-20 ENCOUNTER — Telehealth: Payer: Self-pay

## 2021-11-20 ENCOUNTER — Telehealth: Payer: Self-pay | Admitting: Nurse Practitioner

## 2021-11-20 MED ORDER — BETAMETHASONE DIPROPIONATE 0.05 % EX CREA: TOPICAL_CREAM | Freq: Two times a day (BID) | CUTANEOUS | 1 refills | Status: DC

## 2021-11-20 NOTE — Telephone Encounter (Signed)
Patient came in office today to express his frustration with getting refills for medication for psoriasis. Patient states he needs Betamethasone dipropionale 0.05%  and DIPROLENE 0.05 cream called in to [facility]. Patient states he would like these medications refilled when all his other medications are filled in the future.  Informed patient that medications were sent to [facility] by Jon Billings, NP.Marland Kitchen

## 2021-11-20 NOTE — Telephone Encounter (Signed)
Patient walked in stating he needed a refill on cream for psoriasis.   He takes both the lotion and the cream. Refilled medication during visit.

## 2021-12-20 DIAGNOSIS — H524 Presbyopia: Secondary | ICD-10-CM | POA: Diagnosis not present

## 2021-12-25 DIAGNOSIS — Z0100 Encounter for examination of eyes and vision without abnormal findings: Secondary | ICD-10-CM | POA: Diagnosis not present

## 2022-02-11 NOTE — Progress Notes (Unsigned)
There were no vitals taken for this visit.   Subjective:    Patient ID: Robert Zuniga, male    DOB: 1955-06-15, 67 y.o.   MRN: [id number]  HPI: Robert Zuniga is a 67 y.o. male presenting on 02/12/2022 for comprehensive medical examination. Current medical complaints include:{Blank single:19197::"none","***"}  He currently lives with: Interim Problems from his last visit: {Blank single:19197::"yes","no"}  HYPERTENSION / HYPERLIPIDEMIA Satisfied with current treatment? no Duration of hypertension: years BP monitoring frequency: not checking BP range:  BP medication side effects: no Past BP meds: Metoprolol and enalapril and HCTZ Duration of hyperlipidemia: years Cholesterol medication side effects: no Cholesterol supplements: none Past cholesterol medications: simvastatin (zocor) Medication compliance: excellent compliance Aspirin: yes Recent stressors: no Recurrent headaches: no Visual changes: no Palpitations: no Dyspnea: no Chest pain: no Lower extremity edema: no Dizzy/lightheaded: no  BPH BPH status: controlled Satisfied with current treatment?: yes Medication side effects: no Medication compliance: excellent compliance Duration: years Nocturia: no Urinary frequency:no Incomplete voiding: no Urgency: no Weak urinary stream: no Straining to start stream: no Dysuria: no Onset: gradual Severity: severe  Depression Screen done today and results listed below:     08/12/2021    8:19 AM 07/03/2021   10:54 AM 02/11/2021    8:10 AM 08/09/2020   10:19 AM 11/04/2019    4:56 PM  Depression screen PHQ 2/9  Decreased Interest 0 3 0 0 0  Down, Depressed, Hopeless 0 2 0 0 0  PHQ - 2 Score 0 5 0 0 0  Altered sleeping 0 0 0    Tired, decreased energy 0 0 0    Change in appetite 0 0 0    Feeling bad or failure about yourself  0 2 0    Trouble concentrating 0 0 0    Moving slowly or fidgety/restless 0 0 0    Suicidal thoughts 0 0     PHQ-9 Score 0 7 0    Difficult  doing work/chores Not difficult at all Not difficult at all Not difficult at all      The patient {has/does not have:19849} a history of falls. I {did/did not:19850} complete a risk assessment for falls. A plan of care for falls {was/was not:19852} documented.   Past Medical History:  Past Medical History:  Diagnosis Date   Bladder hemorrhage 2015   CAD (coronary artery disease)    Hyperlipidemia    Hypertension    MI (myocardial infarction) ([place])    X3   Psoriasis     Surgical History:  Past Surgical History:  Procedure Laterality Date   CORONARY ANGIOPLASTY WITH STENT PLACEMENT     CYSTOSTOMY W/ BLADDER BIOPSY     JOINT REPLACEMENT Bilateral    hips   PROSTATE SURGERY  2015   polyps -resolved    Medications:  Current Outpatient Medications on File Prior to Visit  Medication Sig   aspirin EC 81 MG tablet Take 81 mg by mouth daily.   betamethasone dipropionate 0.05 % cream Apply topically 2 (two) times daily.   betamethasone dipropionate 0.05 % lotion Apply to affected area every 12 hours as needed.   calcium & magnesium carbonates (MYLANTA) 737-106 MG tablet Take 1 tablet by mouth daily.   clopidogrel (PLAVIX) 75 MG tablet Take 1 tablet (75 mg total) by mouth daily.   enalapril (VASOTEC) 10 MG tablet Take 1 tablet (10 mg total) by mouth daily.   finasteride (PROSCAR) 5 MG tablet Take 1 tablet (5 mg total) by mouth daily.  hydrochlorothiazide (HYDRODIURIL) 12.5 MG tablet Take 1 tablet (12.5 mg total) by mouth daily.   ketoconazole (NIZORAL) 2 % shampoo USE 1 APPLICATION TOPICALLY TWICE A WEEK. Strength: 2 %   metoprolol tartrate (LOPRESSOR) 25 MG tablet Take 1 tablet (25 mg total) by mouth daily.   simvastatin (ZOCOR) 40 MG tablet Take 1 tablet (40 mg total) by mouth daily.   tamsulosin (FLOMAX) 0.4 MG CAPS capsule Take 1 capsule (0.4 mg total) by mouth daily.   No current facility-administered medications on file prior to visit.    Allergies:  Allergies  Allergen  Reactions   Morphine Nausea Only and Nausea And Vomiting   Haemophilus Influenzae Other (See Comments)    Joint pain Joint pain   Influenza Vaccine Recombinant Other (See Comments)    Joint pain   Levaquin [Levofloxacin In D5w] Nausea And Vomiting   Niaspan [Niacin Er] Hives   Pneumovax [Pneumococcal Polysaccharide Vaccine] Other (See Comments)    Joint pain    Social History:  Social History   Socioeconomic History   Marital status: Married    Spouse name: Not on file   Number of children: Not on file   Years of education: Not on file   Highest education level: Not on file  Occupational History   Not on file  Tobacco Use   Smoking status: Former    Packs/day: 1.00    Years: 2.00    Total pack years: 2.00    Types: Cigarettes    Quit date: 09/12/1987    Years since quitting: 34.4   Smokeless tobacco: Never  Substance and Sexual Activity   Alcohol use: No    Alcohol/week: 0.0 standard drinks of alcohol   Drug use: No   Sexual activity: Not on file  Other Topics Concern   Not on file  Social History Narrative   Not on file   Social Determinants of Health   Financial Resource Strain: Low Risk  (03/19/2021)   Overall Financial Resource Strain (CARDIA)    Difficulty of Paying Living Expenses: Not hard at all  Food Insecurity: No Food Insecurity (03/19/2021)   Hunger Vital Sign    Worried About Running Out of Food in the Last Year: Never true    Ran Out of Food in the Last Year: Never true  Transportation Needs: No Transportation Needs (03/19/2021)   PRAPARE - Hydrologist (Medical): No    Lack of Transportation (Non-Medical): No  Physical Activity: Sufficiently Active (03/19/2021)   Exercise Vital Sign    Days of Exercise per Week: 4 days    Minutes of Exercise per Session: 40 min  Stress: No Stress Concern Present (03/19/2021)   [place]    Feeling of Stress : Not at all   Social Connections: Moderately Integrated (03/19/2021)   Social Connection and Isolation Panel [NHANES]    Frequency of Communication with Friends and Family: More than three times a week    Frequency of Social Gatherings with Friends and Family: Once a week    Attends Religious Services: More than 4 times per year    Active Member of [employer] or Organizations: No    Attends Archivist Meetings: Never    Marital Status: Married  Human resources officer Violence: Not At Risk (03/19/2021)   Humiliation, Afraid, Rape, and Kick questionnaire    Fear of Current or Ex-Partner: No    Emotionally Abused: No    Physically Abused:  No    Sexually Abused: No   Social History   Tobacco Use  Smoking Status Former   Packs/day: 1.00   Years: 2.00   Total pack years: 2.00   Types: Cigarettes   Quit date: 09/12/1987   Years since quitting: 34.4  Smokeless Tobacco Never   Social History   Substance and Sexual Activity  Alcohol Use No   Alcohol/week: 0.0 standard drinks of alcohol    Family History:  Family History  Problem Relation Age of Onset   Diabetes Mother    Dementia Mother    Heart disease Father    Cancer Sister    Liver disease Sister    Heart disease Maternal Grandfather    Heart disease Paternal Grandfather     Past medical history, surgical history, medications, allergies, family history and social history reviewed with patient today and changes made to appropriate areas of the chart.   ROS All other ROS negative except what is listed above and in the HPI.      Objective:    There were no vitals taken for this visit.  Wt Readings from Last 3 Encounters:  08/12/21 181 lb 6.4 oz (82.3 kg)  07/03/21 186 lb 3.2 oz (84.5 kg)  02/11/21 183 lb (83 kg)    Physical Exam  Results for orders placed or performed in visit on 08/12/21  Comp Met (CMET)  Result Value Ref Range   Glucose 123 (H) 70 - 99 mg/dL   BUN 15 8 - 27 mg/dL   Creatinine, Ser 0.84 0.76 - 1.27 mg/dL    eGFR 96 >59 mL/min/1.73   BUN/Creatinine Ratio 18 10 - 24   Sodium 142 134 - 144 mmol/L   Potassium 4.0 3.5 - 5.2 mmol/L   Chloride 106 96 - 106 mmol/L   CO2 22 20 - 29 mmol/L   Calcium 9.4 8.6 - 10.2 mg/dL   Total Protein 6.6 6.0 - 8.5 g/dL   Albumin 4.3 3.9 - 4.9 g/dL   Globulin, Total 2.3 1.5 - 4.5 g/dL   Albumin/Globulin Ratio 1.9 1.2 - 2.2   Bilirubin Total 0.7 0.0 - 1.2 mg/dL   Alkaline Phosphatase 114 44 - 121 IU/L   AST 23 0 - 40 IU/L   ALT 18 0 - 44 IU/L  Lipid Profile  Result Value Ref Range   Cholesterol, Total 143 100 - 199 mg/dL   Triglycerides 90 0 - 149 mg/dL   HDL 46 >39 mg/dL   VLDL Cholesterol Cal 17 5 - 40 mg/dL   LDL Chol Calc (NIH) 80 0 - 99 mg/dL   Chol/HDL Ratio 3.1 0.0 - 5.0 ratio  HgB A1c  Result Value Ref Range   Hgb A1c MFr Bld 6.2 (H) 4.8 - 5.6 %   Est. average glucose Bld gHb Est-mCnc 131 mg/dL      Assessment & Plan:   Problem List Items Addressed This Visit       Cardiovascular and Mediastinum   Hypertension - Primary   CAD (coronary artery disease)   Purpura senilis (HCC)     Endocrine   IFG (impaired fasting glucose)     Other   Hyperlipidemia     Discussed aspirin prophylaxis for myocardial infarction prevention and decision was {Blank single:19197::"it was not indicated","made to continue ASA","made to start ASA","made to stop ASA","that we recommended ASA, and patient refused"}  LABORATORY TESTING:  Health maintenance labs ordered today as discussed above.   The natural history of prostate cancer and ongoing  controversy regarding screening and potential treatment outcomes of prostate cancer has been discussed with the patient. The meaning of a false positive PSA and a false negative PSA has been discussed. He indicates understanding of the limitations of this screening test and wishes *** to proceed with screening PSA testing.   IMMUNIZATIONS:   - Tdap: Tetanus vaccination status reviewed: {tetanus status:315746}. -  Influenza: {Blank single:19197::"Up to date","Administered today","Postponed to flu season","Refused","Given elsewhere"} - Pneumovax: {Blank single:19197::"Up to date","Administered today","Not applicable","Refused","Given elsewhere"} - Prevnar: {Blank single:19197::"Up to date","Administered today","Not applicable","Refused","Given elsewhere"} - COVID: {Blank single:19197::"Up to date","Administered today","Not applicable","Refused","Given elsewhere"} - HPV: {Blank single:19197::"Up to date","Administered today","Not applicable","Refused","Given elsewhere"} - Shingrix vaccine: {Blank single:19197::"Up to date","Administered today","Not applicable","Refused","Given elsewhere"}  SCREENING: - Colonoscopy: {Blank single:19197::"Up to date","Ordered today","Not applicable","Refused","Done elsewhere"}  Discussed with patient purpose of the colonoscopy is to detect colon cancer at curable precancerous or early stages   - AAA Screening: {Blank single:19197::"Up to date","Ordered today","Not applicable","Refused","Done elsewhere"}  -Hearing Test: {Blank single:19197::"Up to date","Ordered today","Not applicable","Refused","Done elsewhere"}  -Spirometry: {Blank single:19197::"Up to date","Ordered today","Not applicable","Refused","Done elsewhere"}   PATIENT COUNSELING:    Sexuality: Discussed sexually transmitted diseases, partner selection, use of condoms, avoidance of unintended pregnancy  and contraceptive alternatives.   Advised to avoid cigarette smoking.  I discussed with the patient that most people either abstain from alcohol or drink within safe limits (<=14/week and <=4 drinks/occasion for males, <=7/weeks and <= 3 drinks/occasion for females) and that the risk for alcohol disorders and other health effects rises proportionally with the number of drinks per week and how often a drinker exceeds daily limits.  Discussed cessation/primary prevention of drug use and availability of treatment for  abuse.   Diet: Encouraged to adjust caloric intake to maintain  or achieve ideal body weight, to reduce intake of dietary saturated fat and total fat, to limit sodium intake by avoiding high sodium foods and not adding table salt, and to maintain adequate dietary potassium and calcium preferably from fresh fruits, vegetables, and low-fat dairy products.    stressed the importance of regular exercise  Injury prevention: Discussed safety belts, safety helmets, smoke detector, smoking near bedding or upholstery.   Dental health: Discussed importance of regular tooth brushing, flossing, and dental visits.   Follow up plan: NEXT PREVENTATIVE PHYSICAL DUE IN 1 YEAR. No follow-ups on file.

## 2022-02-12 ENCOUNTER — Encounter: Payer: Self-pay | Admitting: Nurse Practitioner

## 2022-02-12 ENCOUNTER — Ambulatory Visit (INDEPENDENT_AMBULATORY_CARE_PROVIDER_SITE_OTHER): Payer: No Typology Code available for payment source | Admitting: Nurse Practitioner

## 2022-02-12 VITALS — BP 121/67 | HR 61 | Temp 97.9°F | Ht 68.8 in | Wt 183.0 lb

## 2022-02-12 DIAGNOSIS — D229 Melanocytic nevi, unspecified: Secondary | ICD-10-CM

## 2022-02-12 DIAGNOSIS — E782 Mixed hyperlipidemia: Secondary | ICD-10-CM

## 2022-02-12 DIAGNOSIS — D692 Other nonthrombocytopenic purpura: Secondary | ICD-10-CM

## 2022-02-12 DIAGNOSIS — R8290 Unspecified abnormal findings in urine: Secondary | ICD-10-CM

## 2022-02-12 DIAGNOSIS — N400 Enlarged prostate without lower urinary tract symptoms: Secondary | ICD-10-CM

## 2022-02-12 DIAGNOSIS — R7301 Impaired fasting glucose: Secondary | ICD-10-CM | POA: Diagnosis not present

## 2022-02-12 DIAGNOSIS — Z0000 Encounter for general adult medical examination without abnormal findings: Secondary | ICD-10-CM

## 2022-02-12 DIAGNOSIS — I10 Essential (primary) hypertension: Secondary | ICD-10-CM

## 2022-02-12 DIAGNOSIS — Z1211 Encounter for screening for malignant neoplasm of colon: Secondary | ICD-10-CM

## 2022-02-12 DIAGNOSIS — I2583 Coronary atherosclerosis due to lipid rich plaque: Secondary | ICD-10-CM

## 2022-02-12 DIAGNOSIS — I2510 Atherosclerotic heart disease of native coronary artery without angina pectoris: Secondary | ICD-10-CM | POA: Diagnosis not present

## 2022-02-12 LAB — URINALYSIS, ROUTINE W REFLEX MICROSCOPIC: Specific Gravity, UA: 1.025 (ref 1.005–1.030)

## 2022-02-12 LAB — MICROSCOPIC EXAMINATION: Epithelial Cells (non renal): NONE SEEN /hpf (ref 0–10)

## 2022-02-12 MED ORDER — BETAMETHASONE DIPROPIONATE 0.05 % EX LOTN: TOPICAL_LOTION | CUTANEOUS | 1 refills | Status: DC

## 2022-02-12 MED ORDER — BETAMETHASONE DIPROPIONATE 0.05 % EX CREA: TOPICAL_CREAM | Freq: Two times a day (BID) | CUTANEOUS | 1 refills | Status: DC

## 2022-02-12 NOTE — Assessment & Plan Note (Signed)
Labs ordered at visit today.  Will make recommendations based on lab results.   

## 2022-02-12 NOTE — Assessment & Plan Note (Signed)
Chronic.  Controlled.  Continue with current medication regimen of Simvastatin daily.   Refills sent today.  Labs ordered today.  Return to clinic in 6 months for reevaluation.  Call sooner if concerns arise.

## 2022-02-12 NOTE — Progress Notes (Signed)
Please let patient know that I spoke with the lab and they let me know he was having some pain with urination and took AZO last night.  It made the same abnormal so I did send it out for further evaluation to make sure nothing else was going on.  It will take a couple of days for that to come back but I will let him know what it says when the result returns.

## 2022-02-12 NOTE — Assessment & Plan Note (Signed)
Chronic.  Controlled.  Continue with current medication regimen of Simvastatin '40mg'$  daily.  Labs ordered today.  Return to clinic in 6 months for reevaluation.  Call sooner if concerns arise.

## 2022-02-12 NOTE — Assessment & Plan Note (Signed)
Chronic. Reassured patient.  Will continue to monitor.

## 2022-02-12 NOTE — Addendum Note (Signed)
Addended by: Jon Billings on: 02/12/2022 12:11 PM   Modules accepted: Orders

## 2022-02-12 NOTE — Assessment & Plan Note (Signed)
Chronic.  Controlled.  Continue with current medication regimen on Metoprolol '25mg'$  daily, Enalapril '10mg'$  daily, and HCTZ 12.'5mg'$  daily.  Labs ordered today.  Return to clinic in 6 months for reevaluation.  Call sooner if concerns arise.

## 2022-02-12 NOTE — Assessment & Plan Note (Signed)
Chronic.  Controlled.  Continue with current medication regimen of tamsulosin.  Doing well, no concerns at visit today.  Labs ordered today.  Return to clinic in 6 months for reevaluation.  Call sooner if concerns arise.

## 2022-02-13 LAB — CBC WITH DIFFERENTIAL/PLATELET
Basophils Absolute: 0.1 10*3/uL (ref 0.0–0.2)
Basos: 1 %
EOS (ABSOLUTE): 0.3 10*3/uL (ref 0.0–0.4)
Eos: 7 %
Hematocrit: 48.2 % (ref 37.5–51.0)
Hemoglobin: 16.9 g/dL (ref 13.0–17.7)
Immature Grans (Abs): 0 10*3/uL (ref 0.0–0.1)
Immature Granulocytes: 1 %
Lymphocytes Absolute: 1 10*3/uL (ref 0.7–3.1)
Lymphs: 27 %
MCH: 30.5 pg (ref 26.6–33.0)
MCHC: 35.1 g/dL (ref 31.5–35.7)
MCV: 87 fL (ref 79–97)
Monocytes Absolute: 0.3 10*3/uL (ref 0.1–0.9)
Monocytes: 9 %
Neutrophils Absolute: 2.1 10*3/uL (ref 1.4–7.0)
Neutrophils: 55 %
Platelets: 145 10*3/uL — ABNORMAL LOW (ref 150–450)
RBC: 5.54 x10E6/uL (ref 4.14–5.80)
RDW: 12.7 % (ref 11.6–15.4)
WBC: 3.7 10*3/uL (ref 3.4–10.8)

## 2022-02-13 LAB — HEMOGLOBIN A1C
Est. average glucose Bld gHb Est-mCnc: 131 mg/dL
Hgb A1c MFr Bld: 6.2 % — ABNORMAL HIGH (ref 4.8–5.6)

## 2022-02-13 LAB — COMPREHENSIVE METABOLIC PANEL
ALT: 25 IU/L (ref 0–44)
AST: 21 IU/L (ref 0–40)
Albumin/Globulin Ratio: 2 (ref 1.2–2.2)
Albumin: 4.2 g/dL (ref 3.9–4.9)
Alkaline Phosphatase: 114 IU/L (ref 44–121)
BUN/Creatinine Ratio: 17 (ref 10–24)
BUN: 15 mg/dL (ref 8–27)
Bilirubin Total: 0.7 mg/dL (ref 0.0–1.2)
CO2: 23 mmol/L (ref 20–29)
Calcium: 9.4 mg/dL (ref 8.6–10.2)
Chloride: 102 mmol/L (ref 96–106)
Creatinine, Ser: 0.86 mg/dL (ref 0.76–1.27)
Globulin, Total: 2.1 g/dL (ref 1.5–4.5)
Glucose: 123 mg/dL — ABNORMAL HIGH (ref 70–99)
Potassium: 4 mmol/L (ref 3.5–5.2)
Sodium: 141 mmol/L (ref 134–144)
Total Protein: 6.3 g/dL (ref 6.0–8.5)
eGFR: 95 mL/min/{1.73_m2} (ref >59)

## 2022-02-13 LAB — LIPID PANEL
Chol/HDL Ratio: 2.9 ratio (ref 0.0–5.0)
Cholesterol, Total: 132 mg/dL (ref 100–199)
HDL: 46 mg/dL (ref >39)
LDL Chol Calc (NIH): 74 mg/dL (ref 0–99)
Triglycerides: 55 mg/dL (ref 0–149)
VLDL Cholesterol Cal: 12 mg/dL (ref 5–40)

## 2022-02-13 LAB — PSA: Prostate Specific Ag, Serum: 1.1 ng/mL (ref 0.0–4.0)

## 2022-02-13 LAB — TSH: TSH: 1.3 u[IU]/mL (ref 0.450–4.500)

## 2022-02-13 NOTE — Progress Notes (Signed)
Good Morning Mr. Robert Zuniga.  It was nice to see you yesterday.  Your lab work looks good.  Your platelets are slightly below normal.  However, this has been consistenty for you for the last several years.  There is no treatment at this time, we will keep an eye on it at future appointment.  Your A1c is well controlled at 6.2.  Keep up the good work.  No concerns at this time. Continue with your current medication regimen.  Follow up as discussed.  Please let me know if you have any questions.

## 2022-02-14 LAB — URINE CULTURE

## 2022-02-17 NOTE — Progress Notes (Signed)
Good Morning Robert Zuniga.  Your urine did not grow any bacteria.  If your symptoms are persistent please come back in for an appt so we get retest your urine with out the [place].

## 2022-03-31 ENCOUNTER — Ambulatory Visit (INDEPENDENT_AMBULATORY_CARE_PROVIDER_SITE_OTHER): Payer: No Typology Code available for payment source

## 2022-03-31 VITALS — Ht 68.0 in | Wt 180.4 lb

## 2022-03-31 DIAGNOSIS — Z0000 Encounter for general adult medical examination without abnormal findings: Secondary | ICD-10-CM | POA: Diagnosis not present

## 2022-03-31 DIAGNOSIS — Z1211 Encounter for screening for malignant neoplasm of colon: Secondary | ICD-10-CM

## 2022-03-31 NOTE — Patient Instructions (Signed)
Robert Zuniga , Thank you for taking time to come for your Medicare Wellness Visit. I appreciate your ongoing commitment to your health goals. Please review the following plan we discussed and let me know if I can assist you in the future.   These are the goals we discussed:  Goals      DIET - EAT MORE FRUITS AND VEGETABLES     Increase physical activity     Water intake Cut down on sugar        This is a list of the screening recommended for you and due dates:  Health Maintenance  Topic Date Due   COVID-19 Vaccine (1) Never done   Colon Cancer Screening  Never done   Zoster (Shingles) Vaccine (1 of 2) 05/13/2022*   DTaP/Tdap/Td vaccine (3 - Tdap) 11/03/2029   Hepatitis C Screening: USPSTF Recommendation to screen - Ages 31-79 yo.  Completed   HPV Vaccine  Aged Out   Pneumonia Vaccine  Discontinued  *Topic was postponed. The date shown is not the original due date.    Advanced directives: no  Conditions/risks identified: none  Next appointment: Follow up in one year for your annual wellness visit. 04/06/23 @ 8:45 am in person  Preventive Care 68 Years and Older, Male  Preventive care refers to lifestyle choices and visits with your health care provider that can promote health and wellness. What does preventive care include? A yearly physical exam. This is also called an annual well check. Dental exams once or twice a year. Routine eye exams. Ask your health care provider how often you should have your eyes checked. Personal lifestyle choices, including: Daily care of your teeth and gums. Regular physical activity. Eating a healthy diet. Avoiding tobacco and drug use. Limiting alcohol use. Practicing safe sex. Taking low doses of aspirin every day. Taking vitamin and mineral supplements as recommended by your health care provider. What happens during an annual well check? The services and screenings done by your health care provider during your annual well check will  depend on your age, overall health, lifestyle risk factors, and family history of disease. Counseling  Your health care provider may ask you questions about your: Alcohol use. Tobacco use. Drug use. Emotional well-being. Home and relationship well-being. Sexual activity. Eating habits. History of falls. Memory and ability to understand (cognition). Work and work Statistician. Screening  You may have the following tests or measurements: Height, weight, and BMI. Blood pressure. Lipid and cholesterol levels. These may be checked every 5 years, or more frequently if you are over 36 years old. Skin check. Lung cancer screening. You may have this screening every year starting at age 68 if you have a 30-pack-year history of smoking and currently smoke or have quit within the past 15 years. Fecal occult blood test (FOBT) of the stool. You may have this test every year starting at age 25. Flexible sigmoidoscopy or colonoscopy. You may have a sigmoidoscopy every 5 years or a colonoscopy every 10 years starting at age 50. Prostate cancer screening. Recommendations will vary depending on your family history and other risks. Hepatitis C blood test. Hepatitis B blood test. Sexually transmitted disease (STD) testing. Diabetes screening. This is done by checking your blood sugar (glucose) after you have not eaten for a while (fasting). You may have this done every 1-3 years. Abdominal aortic aneurysm (AAA) screening. You may need this if you are a current or former smoker. Osteoporosis. You may be screened starting at age 40  if you are at high risk. Talk with your health care provider about your test results, treatment options, and if necessary, the need for more tests. Vaccines  Your health care provider may recommend certain vaccines, such as: Influenza vaccine. This is recommended every year. Tetanus, diphtheria, and acellular pertussis (Tdap, Td) vaccine. You may need a Td booster every 10  years. Zoster vaccine. You may need this after age 68 Pneumococcal 13-valent conjugate (PCV13) vaccine. One dose is recommended after age 20. Pneumococcal polysaccharide (PPSV23) vaccine. One dose is recommended after age 68 Talk to your health care provider about which screenings and vaccines you need and how often you need them. This information is not intended to replace advice given to you by your health care provider. Make sure you discuss any questions you have with your health care provider. Document Released: 01/26/2015 Document Revised: 09/19/2015 Document Reviewed: 10/31/2014 Elsevier Interactive Patient Education  2017 [place] Prevention in the Home Falls can cause injuries. They can happen to people of all ages. There are many things you can do to make your home safe and to help prevent falls. What can I do on the outside of my home? Regularly fix the edges of walkways and driveways and fix any cracks. Remove anything that might make you trip as you walk through a door, such as a raised step or threshold. Trim any bushes or trees on the path to your home. Use bright outdoor lighting. Clear any walking paths of anything that might make someone trip, such as rocks or tools. Regularly check to see if handrails are loose or broken. Make sure that both sides of any steps have handrails. Any raised decks and porches should have guardrails on the edges. Have any leaves, snow, or ice cleared regularly. Use sand or salt on walking paths during winter. Clean up any spills in your garage right away. This includes oil or grease spills. What can I do in the bathroom? Use night lights. Install grab bars by the toilet and in the tub and shower. Do not use towel bars as grab bars. Use non-skid mats or decals in the tub or shower. If you need to sit down in the shower, use a plastic, non-slip stool. Keep the floor dry. Clean up any water that spills on the floor as soon as it  happens. Remove soap buildup in the tub or shower regularly. Attach bath mats securely with double-sided non-slip rug tape. Do not have throw rugs and other things on the floor that can make you trip. What can I do in the bedroom? Use night lights. Make sure that you have a light by your bed that is easy to reach. Do not use any sheets or blankets that are too big for your bed. They should not hang down onto the floor. Have a firm chair that has side arms. You can use this for support while you get dressed. Do not have throw rugs and other things on the floor that can make you trip. What can I do in the kitchen? Clean up any spills right away. Avoid walking on wet floors. Keep items that you use a lot in easy-to-reach places. If you need to reach something above you, use a strong step stool that has a grab bar. Keep electrical cords out of the way. Do not use floor polish or wax that makes floors slippery. If you must use wax, use non-skid floor wax. Do not have throw rugs and other things  on the floor that can make you trip. What can I do with my stairs? Do not leave any items on the stairs. Make sure that there are handrails on both sides of the stairs and use them. Fix handrails that are broken or loose. Make sure that handrails are as long as the stairways. Check any carpeting to make sure that it is firmly attached to the stairs. Fix any carpet that is loose or worn. Avoid having throw rugs at the top or bottom of the stairs. If you do have throw rugs, attach them to the floor with carpet tape. Make sure that you have a light switch at the top of the stairs and the bottom of the stairs. If you do not have them, ask someone to add them for you. What else can I do to help prevent falls? Wear shoes that: Do not have high heels. Have rubber bottoms. Are comfortable and fit you well. Are closed at the toe. Do not wear sandals. If you use a stepladder: Make sure that it is fully opened.  Do not climb a closed stepladder. Make sure that both sides of the stepladder are locked into place. Ask someone to hold it for you, if possible. Clearly mark and make sure that you can see: Any grab bars or handrails. First and last steps. Where the edge of each step is. Use tools that help you move around (mobility aids) if they are needed. These include: Canes. Walkers. Scooters. Crutches. Turn on the lights when you go into a dark area. Replace any light bulbs as soon as they burn out. Set up your furniture so you have a clear path. Avoid moving your furniture around. If any of your floors are uneven, fix them. If there are any pets around you, be aware of where they are. Review your medicines with your doctor. Some medicines can make you feel dizzy. This can increase your chance of falling. Ask your doctor what other things that you can do to help prevent falls. This information is not intended to replace advice given to you by your health care provider. Make sure you discuss any questions you have with your health care provider. Document Released: 10/26/2008 Document Revised: 06/07/2015 Document Reviewed: 02/03/2014 Elsevier Interactive Patient Education  2017 [employer].

## 2022-03-31 NOTE — Progress Notes (Signed)
Subjective:   Robert Zuniga is a 67 y.o. male who presents for an Initial Medicare Annual Wellness Visit.  Review of Systems     Cardiac Risk Factors include: advanced age (>30men, >1 women);hypertension;male gender     Objective:    Today's Vitals   03/31/22 0841  Weight: 180 lb 6.4 oz (81.8 kg)  Height: 5\' 8"  (1.727 m)   Body mass index is 27.43 kg/m.     03/31/2022    8:48 AM 01/19/2020   11:20 AM 02/14/2016   11:30 AM 02/14/2016    7:25 AM  Advanced Directives  Does Patient Have a Medical Advance Directive? No No No No  Would patient like information on creating a medical advance directive? No - Patient declined No - Patient declined No - Patient declined No - Patient declined    Current Medications (verified) Outpatient Encounter Medications as of 03/31/2022  Medication Sig   aspirin EC 81 MG tablet Take 81 mg by mouth daily.   betamethasone dipropionate 0.05 % cream Apply topically 2 (two) times daily.   betamethasone dipropionate 0.05 % lotion Apply to affected area every 12 hours as needed.   calcium & magnesium carbonates (MYLANTA) [id number] MG tablet Take 1 tablet by mouth daily.   clopidogrel (PLAVIX) 75 MG tablet Take 1 tablet (75 mg total) by mouth daily.   enalapril (VASOTEC) 10 MG tablet Take 1 tablet (10 mg total) by mouth daily.   finasteride (PROSCAR) 5 MG tablet Take 1 tablet (5 mg total) by mouth daily.   hydrochlorothiazide (HYDRODIURIL) 12.5 MG tablet Take 1 tablet (12.5 mg total) by mouth daily.   ketoconazole (NIZORAL) 2 % shampoo USE 1 APPLICATION TOPICALLY TWICE A WEEK. Strength: 2 %   metoprolol tartrate (LOPRESSOR) 25 MG tablet Take 1 tablet (25 mg total) by mouth daily.   simvastatin (ZOCOR) 40 MG tablet Take 1 tablet (40 mg total) by mouth daily.   tamsulosin (FLOMAX) 0.4 MG CAPS capsule Take 1 capsule (0.4 mg total) by mouth daily.   No facility-administered encounter medications on file as of 03/31/2022.    Allergies (verified) Morphine,  Haemophilus influenzae, Influenza vaccine recombinant, Levaquin [levofloxacin in d5w], Niaspan [niacin er], and Pneumovax [pneumococcal polysaccharide vaccine]   History: Past Medical History:  Diagnosis Date   Bladder hemorrhage 2015   CAD (coronary artery disease)    Hyperlipidemia    Hypertension    MI (myocardial infarction) ([place])    X3   Psoriasis    Past Surgical History:  Procedure Laterality Date   CORONARY ANGIOPLASTY WITH STENT PLACEMENT     CYSTOSTOMY W/ BLADDER BIOPSY     JOINT REPLACEMENT Bilateral    hips   PROSTATE SURGERY  2015   polyps -resolved   Family History  Problem Relation Age of Onset   Diabetes Mother    Dementia Mother    Heart disease Father    Cancer Sister    Liver disease Sister    Heart disease Maternal Grandfather    Heart disease Paternal Grandfather    Social History   Socioeconomic History   Marital status: Married    Spouse name: Not on file   Number of children: Not on file   Years of education: Not on file   Highest education level: Not on file  Occupational History   Not on file  Tobacco Use   Smoking status: Former    Packs/day: 1.00    Years: 2.00    Additional pack years: 0.00  Total pack years: 2.00    Types: Cigarettes    Quit date: 09/12/1987    Years since quitting: 34.5   Smokeless tobacco: Never  Vaping Use   Vaping Use: Never used  Substance and Sexual Activity   Alcohol use: No    Alcohol/week: 0.0 standard drinks of alcohol   Drug use: No   Sexual activity: Not on file  Other Topics Concern   Not on file  Social History Narrative   Not on file   Social Determinants of Health   Financial Resource Strain: Low Risk  (03/31/2022)   Overall Financial Resource Strain (CARDIA)    Difficulty of Paying Living Expenses: Not hard at all  Food Insecurity: No Food Insecurity (03/31/2022)   Hunger Vital Sign    Worried About Running Out of Food in the Last Year: Never true    Ran Out of Food in the Last Year:  Never true  Transportation Needs: No Transportation Needs (03/31/2022)   PRAPARE - Hydrologist (Medical): No    Lack of Transportation (Non-Medical): No  Physical Activity: Sufficiently Active (03/31/2022)   Exercise Vital Sign    Days of Exercise per Week: 6 days    Minutes of Exercise per Session: 60 min  Stress: No Stress Concern Present (03/31/2022)   [place]    Feeling of Stress : Only a little  Social Connections: Moderately Isolated (03/31/2022)   Social Connection and Isolation Panel [NHANES]    Frequency of Communication with Friends and Family: More than three times a week    Frequency of Social Gatherings with Friends and Family: Never    Attends Religious Services: Never    Printmaker: No    Attends Music therapist: Never    Marital Status: Married    Tobacco Counseling Counseling given: Not Answered   Clinical Intake:  Pre-visit preparation completed: Yes  Pain : No/denies pain     Nutritional Risks: None Diabetes: No  How often do you need to have someone help you when you read instructions, pamphlets, or other written materials from your doctor or pharmacy?: 1 - Never  Diabetic?no  Interpreter Needed?: No  Information entered by :: Kirke Shaggy, LPN   Activities of Daily Living    03/31/2022    8:49 AM  In your present state of health, do you have any difficulty performing the following activities:  Hearing? 0  Vision? 0  Difficulty concentrating or making decisions? 0  Walking or climbing stairs? 0  Dressing or bathing? 0  Doing errands, shopping? 0  Preparing Food and eating ? N  Using the Toilet? N  In the past six months, have you accidently leaked urine? N  Do you have problems with loss of bowel control? N  Managing your Medications? N  Managing your Finances? N  Housekeeping or managing your  Housekeeping? N    Patient Care Team: Jon Billings, NP as PCP - General  Indicate any recent Medical Services you may have received from other than [facility] providers in the past year (date may be approximate).     Assessment:   This is a routine wellness examination for Robert Zuniga.  Hearing/Vision screen Hearing Screening - Comments:: No aids Vision Screening - Comments:: Wears glasses- [facility]  Dietary issues and exercise activities discussed: Current Exercise Habits: The patient has a physically strenuous job, but has no regular exercise apart from work.  Goals Addressed             This Visit's Progress    DIET - EAT MORE FRUITS AND VEGETABLES         Depression Screen    03/31/2022    8:46 AM 02/12/2022    8:10 AM 08/12/2021    8:19 AM 07/03/2021   10:54 AM 02/11/2021    8:10 AM 08/09/2020   10:19 AM 11/04/2019    4:56 PM  PHQ 2/9 Scores  PHQ - 2 Score 0 0 0 5 0 0 0  PHQ- 9 Score 2 0 0 7 0      Fall Risk    03/31/2022    8:49 AM 02/12/2022    8:08 AM 08/12/2021    8:19 AM 07/03/2021   10:54 AM 03/19/2021   10:35 AM  Fall Risk   Falls in the past year? 0 0 0 0 0  Number falls in past yr: 0 0 0 0 0  Injury with Fall? 0 0 0 0 0  Risk for fall due to : No Fall Risks No Fall Risks No Fall Risks No Fall Risks   Follow up Falls prevention discussed;Falls evaluation completed Falls evaluation completed Falls evaluation completed Falls evaluation completed Falls evaluation completed;Falls prevention discussed;Education provided    FALL RISK PREVENTION PERTAINING TO THE HOME:  Any stairs in or around the home? Yes  If so, are there any without handrails? No  Home free of loose throw rugs in walkways, pet beds, electrical cords, etc? Yes  Adequate lighting in your home to reduce risk of falls? Yes   ASSISTIVE DEVICES UTILIZED TO PREVENT FALLS:  Life alert? No  Use of a cane, walker or w/c? No  Grab bars in the bathroom? Yes  Shower chair or bench in shower?  Yes  Elevated toilet seat or a handicapped toilet? No   TIMED UP AND GO:  Was the test performed? Yes .  Length of time to ambulate 10 feet: 4 sec.   Gait steady and fast without use of assistive device  Cognitive Function:        Immunizations Immunization History  Administered Date(s) Administered   Td 07/01/2007, 11/04/2019    TDAP status: Up to date  Flu Vaccine status: Declined, Education has been provided regarding the importance of this vaccine but patient still declined. Advised may receive this vaccine at local pharmacy or Health Dept. Aware to provide a copy of the vaccination record if obtained from local pharmacy or Health Dept. Verbalized acceptance and understanding.  Pneumococcal vaccine status: Declined,  Education has been provided regarding the importance of this vaccine but patient still declined. Advised may receive this vaccine at local pharmacy or Health Dept. Aware to provide a copy of the vaccination record if obtained from local pharmacy or Health Dept. Verbalized acceptance and understanding.   Covid-19 vaccine status: Declined, Education has been provided regarding the importance of this vaccine but patient still declined. Advised may receive this vaccine at local pharmacy or Health Dept.or vaccine clinic. Aware to provide a copy of the vaccination record if obtained from local pharmacy or Health Dept. Verbalized acceptance and understanding.  Qualifies for Shingles Vaccine? Yes   Zostavax completed No   Shingrix Completed?: No.    Education has been provided regarding the importance of this vaccine. Patient has been advised to call insurance company to determine out of pocket expense if they have not yet received this vaccine. Advised may also receive vaccine at  local pharmacy or Health Dept. Verbalized acceptance and understanding.  Screening Tests Health Maintenance  Topic Date Due   COVID-19 Vaccine (1) Never done   COLONOSCOPY (Pts 45-53yrs  Insurance coverage will need to be confirmed)  Never done   Zoster Vaccines- Shingrix (1 of 2) 05/13/2022 (Originally 04/16/2004)   DTaP/Tdap/Td (3 - Tdap) 11/03/2029   Hepatitis C Screening  Completed   HPV VACCINES  Aged Out   Pneumonia Vaccine 2+ Years old  Discontinued    Health Maintenance  Health Maintenance Due  Topic Date Due   COVID-19 Vaccine (1) Never done   COLONOSCOPY (Pts 45-78yrs Insurance coverage will need to be confirmed)  Never done    Colorectal cancer screening: Referral to GI placed 03/31/22. Pt aware the office will call re: appt.  Lung Cancer Screening: (Low Dose CT Chest recommended if Age 20-80 years, 30 pack-year currently smoking OR have quit w/in 15years.) does not qualify.    Additional Screening:  Hepatitis C Screening: does qualify; Completed 06/17/17  Vision Screening: Recommended annual ophthalmology exams for early detection of glaucoma and other disorders of the eye. Is the patient up to date with their annual eye exam?  Yes  Who is the provider or what is the name of the office in which the patient attends annual eye exams? [facility] If pt is not established with a provider, would they like to be referred to a provider to establish care? No .   Dental Screening: Recommended annual dental exams for proper oral hygiene  Community Resource Referral / Chronic Care Management: CRR required this visit?  No   CCM required this visit?  No      Plan:     I have personally reviewed and noted the following in the patient's chart:   Medical and social history Use of alcohol, tobacco or illicit drugs  Current medications and supplements including opioid prescriptions. Patient is not currently taking opioid prescriptions. Functional ability and status Nutritional status Physical activity Advanced directives List of other physicians Hospitalizations, surgeries, and ER visits in previous 12 months Vitals Screenings to include cognitive,  depression, and falls Referrals and appointments  In addition, I have reviewed and discussed with patient certain preventive protocols, quality metrics, and best practice recommendations. A written personalized care plan for preventive services as well as general preventive health recommendations were provided to patient.     Dionisio David, LPN   [id number]   Nurse Notes: none

## 2022-04-30 DIAGNOSIS — I2510 Atherosclerotic heart disease of native coronary artery without angina pectoris: Secondary | ICD-10-CM | POA: Diagnosis not present

## 2022-04-30 DIAGNOSIS — E785 Hyperlipidemia, unspecified: Secondary | ICD-10-CM | POA: Diagnosis not present

## 2022-04-30 DIAGNOSIS — R001 Bradycardia, unspecified: Secondary | ICD-10-CM | POA: Diagnosis not present

## 2022-04-30 DIAGNOSIS — I10 Essential (primary) hypertension: Secondary | ICD-10-CM | POA: Diagnosis not present

## 2022-05-06 ENCOUNTER — Encounter: Payer: Self-pay | Admitting: Family Medicine

## 2022-05-06 ENCOUNTER — Ambulatory Visit (INDEPENDENT_AMBULATORY_CARE_PROVIDER_SITE_OTHER): Payer: No Typology Code available for payment source | Admitting: Family Medicine

## 2022-05-06 VITALS — BP 120/79 | HR 73 | Temp 98.2°F | Wt 182.4 lb

## 2022-05-06 DIAGNOSIS — J301 Allergic rhinitis due to pollen: Secondary | ICD-10-CM

## 2022-05-06 MED ORDER — PREDNISONE 50 MG PO TABS: 50.0000 mg | ORAL_TABLET | Freq: Every day | ORAL | 0 refills | Status: DC

## 2022-05-06 NOTE — Progress Notes (Signed)
BP 120/79   Pulse 73   Temp 98.2 F (36.8 C) (Oral)   Wt 182 lb 6.4 oz (82.7 kg)   SpO2 96%   BMI 27.73 kg/m    Subjective:    Patient ID: Robert Zuniga, male    DOB: 1955/12/11, 67 y.o.   MRN: [id number]  HPI: Robert Zuniga is a 67 y.o. male  Chief Complaint  Patient presents with   Sore Throat   Cough   Sinus Problem    Patient says he started having symptoms a week ago. Patient says it started with sore throat, coughing up green phlegm, running nose and fever (last one was Saturday). Patient says he try over the counter medication Mucinex and says he does not feel any better. Patient declines any at home COVID testing.    UPPER RESPIRATORY TRACT INFECTION Duration: about a week Worst symptom: congestion Fever: yes- last week Cough: yes Shortness of breath: yes- with cough Wheezing: yes Chest pain: yes, with cough Chest tightness: no Chest congestion: yes Nasal congestion: yes Runny nose: yes Post nasal drip: yes Sneezing: yes Sore throat: yes Swollen glands: no Sinus pressure: yes Headache: yes Face pain: no Toothache: no Ear pain: no  Ear pressure: yes bilateral Eyes red/itching:yes Eye drainage/crusting: yes  Vomiting: no Rash: no Fatigue: yes Sick contacts: no Strep contacts: no  Context: stable Recurrent sinusitis: no Relief with OTC cold/cough medications: no  Treatments attempted: mucinex   Relevant past medical, surgical, family and social history reviewed and updated as indicated. Interim medical history since our last visit reviewed. Allergies and medications reviewed and updated.  Review of Systems  Constitutional: Negative.   HENT:  Positive for congestion, postnasal drip and sinus pressure. Negative for dental problem, drooling, ear discharge, ear pain, facial swelling, hearing loss, mouth sores, nosebleeds, rhinorrhea, sinus pain, sneezing, sore throat, tinnitus, trouble swallowing and voice change.   Respiratory:  Positive for cough,  shortness of breath and wheezing. Negative for apnea, choking, chest tightness and stridor.   Cardiovascular: Negative.   Musculoskeletal: Negative.   Neurological: Negative.   Psychiatric/Behavioral: Negative.      Per HPI unless specifically indicated above     Objective:    BP 120/79   Pulse 73   Temp 98.2 F (36.8 C) (Oral)   Wt 182 lb 6.4 oz (82.7 kg)   SpO2 96%   BMI 27.73 kg/m   Wt Readings from Last 3 Encounters:  05/06/22 182 lb 6.4 oz (82.7 kg)  03/31/22 180 lb 6.4 oz (81.8 kg)  02/12/22 183 lb (83 kg)    Physical Exam Vitals and nursing note reviewed.  Constitutional:      General: He is not in acute distress.    Appearance: Normal appearance. He is well-developed and normal weight. He is not ill-appearing, toxic-appearing or diaphoretic.  HENT:     Head: Normocephalic and atraumatic.     Right Ear: Tympanic membrane, ear canal and external ear normal.     Left Ear: Tympanic membrane, ear canal and external ear normal.     Nose: Nose normal. No congestion or rhinorrhea.     Mouth/Throat:     Mouth: Mucous membranes are moist. No oral lesions.     Pharynx: Oropharynx is clear. No pharyngeal swelling, oropharyngeal exudate, posterior oropharyngeal erythema or uvula swelling.  Eyes:     General: No scleral icterus.       Right eye: No discharge.        Left eye: No discharge.  Extraocular Movements: Extraocular movements intact.     Conjunctiva/sclera: Conjunctivae normal.     Pupils: Pupils are equal, round, and reactive to light.  Cardiovascular:     Rate and Rhythm: Normal rate and regular rhythm.     Pulses: Normal pulses.     Heart sounds: Normal heart sounds. No murmur heard.    No friction rub. No gallop.  Pulmonary:     Effort: Pulmonary effort is normal. No respiratory distress.     Breath sounds: Normal breath sounds. No stridor. No wheezing, rhonchi or rales.  Chest:     Chest wall: No tenderness.  Musculoskeletal:        General: Normal  range of motion.     Cervical back: Normal range of motion and neck supple.  Skin:    General: Skin is warm and dry.     Capillary Refill: Capillary refill takes less than 2 seconds.     Coloration: Skin is not jaundiced or pale.     Findings: No bruising, erythema, lesion or rash.  Neurological:     General: No focal deficit present.     Mental Status: He is alert and oriented to person, place, and time. Mental status is at baseline.  Psychiatric:        Mood and Affect: Mood normal.        Behavior: Behavior normal.        Thought Content: Thought content normal.        Judgment: Judgment normal.     Results for orders placed or performed in visit on 02/12/22  Microscopic Examination   Urine  Result Value Ref Range   WBC, UA 0-5 0 - 5 /hpf   RBC, Urine 3-10 (A) 0 - 2 /hpf   Epithelial Cells (non renal) None seen 0 - 10 /hpf   Mucus, UA Present (A) Not Estab.   Bacteria, UA Few None seen/Few  Urine Culture   Specimen: Urine   UR  Result Value Ref Range   Urine Culture, Routine Final report    Organism ID, Bacteria Comment   TSH  Result Value Ref Range   TSH 1.300 0.450 - 4.500 uIU/mL  PSA  Result Value Ref Range   Prostate Specific Ag, Serum 1.1 0.0 - 4.0 ng/mL  Lipid panel  Result Value Ref Range   Cholesterol, Total 132 100 - 199 mg/dL   Triglycerides 55 0 - 149 mg/dL   HDL 46 >65 mg/dL   VLDL Cholesterol Cal 12 5 - 40 mg/dL   LDL Chol Calc (NIH) 74 0 - 99 mg/dL   Chol/HDL Ratio 2.9 0.0 - 5.0 ratio  CBC with Differential/Platelet  Result Value Ref Range   WBC 3.7 3.4 - 10.8 x10E3/uL   RBC 5.54 4.14 - 5.80 x10E6/uL   Hemoglobin 16.9 13.0 - 17.7 g/dL   Hematocrit 78.4 69.6 - 51.0 %   MCV 87 79 - 97 fL   MCH 30.5 26.6 - 33.0 pg   MCHC 35.1 31.5 - 35.7 g/dL   RDW 29.5 28.4 - 13.2 %   Platelets 145 (L) 150 - 450 x10E3/uL   Neutrophils 55 Not Estab. %   Lymphs 27 Not Estab. %   Monocytes 9 Not Estab. %   Eos 7 Not Estab. %   Basos 1 Not Estab. %    Neutrophils Absolute 2.1 1.4 - 7.0 x10E3/uL   Lymphocytes Absolute 1.0 0.7 - 3.1 x10E3/uL   Monocytes Absolute 0.3 0.1 - 0.9 x10E3/uL  EOS (ABSOLUTE) 0.3 0.0 - 0.4 x10E3/uL   Basophils Absolute 0.1 0.0 - 0.2 x10E3/uL   Immature Granulocytes 1 Not Estab. %   Immature Grans (Abs) 0.0 0.0 - 0.1 x10E3/uL  Comprehensive metabolic panel  Result Value Ref Range   Glucose 123 (H) 70 - 99 mg/dL   BUN 15 8 - 27 mg/dL   Creatinine, Ser 1.61 0.76 - 1.27 mg/dL   eGFR 95 >09 UE/AVW/0.98   BUN/Creatinine Ratio 17 10 - 24   Sodium 141 134 - 144 mmol/L   Potassium 4.0 3.5 - 5.2 mmol/L   Chloride 102 96 - 106 mmol/L   CO2 23 20 - 29 mmol/L   Calcium 9.4 8.6 - 10.2 mg/dL   Total Protein 6.3 6.0 - 8.5 g/dL   Albumin 4.2 3.9 - 4.9 g/dL   Globulin, Total 2.1 1.5 - 4.5 g/dL   Albumin/Globulin Ratio 2.0 1.2 - 2.2   Bilirubin Total 0.7 0.0 - 1.2 mg/dL   Alkaline Phosphatase 114 44 - 121 IU/L   AST 21 0 - 40 IU/L   ALT 25 0 - 44 IU/L  Urinalysis, Routine w reflex microscopic  Result Value Ref Range   Specific Gravity, UA 1.025 1.005 - 1.030   pH, UA CANCELED    Color, UA Orange Yellow   Appearance Ur Clear Clear   Protein,UA CANCELED    Glucose, UA CANCELED    Ketones, UA CANCELED    Microscopic Examination See below:   HgB A1c  Result Value Ref Range   Hgb A1c MFr Bld 6.2 (H) 4.8 - 5.6 %   Est. average glucose Bld gHb Est-mCnc 131 mg/dL      Assessment & Plan:   Problem List Items Addressed This Visit   None Visit Diagnoses     Seasonal allergic rhinitis due to pollen    -  Primary   Will treat with steroid burst. If not better by Thursday, will send in azithromycin. Call with any concerns.        Follow up plan: Return if symptoms worsen or fail to improve.

## 2022-06-16 ENCOUNTER — Encounter: Payer: Self-pay | Admitting: Nurse Practitioner

## 2022-08-13 ENCOUNTER — Ambulatory Visit: Payer: No Typology Code available for payment source | Admitting: Physician Assistant

## 2022-08-15 ENCOUNTER — Ambulatory Visit: Payer: No Typology Code available for payment source | Admitting: Physician Assistant

## 2022-08-19 ENCOUNTER — Ambulatory Visit (INDEPENDENT_AMBULATORY_CARE_PROVIDER_SITE_OTHER): Payer: No Typology Code available for payment source | Admitting: Physician Assistant

## 2022-08-19 ENCOUNTER — Encounter: Payer: Self-pay | Admitting: Physician Assistant

## 2022-08-19 VITALS — BP 124/74 | HR 54 | Ht 68.0 in | Wt 178.4 lb

## 2022-08-19 DIAGNOSIS — N400 Enlarged prostate without lower urinary tract symptoms: Secondary | ICD-10-CM | POA: Diagnosis not present

## 2022-08-19 DIAGNOSIS — R7301 Impaired fasting glucose: Secondary | ICD-10-CM | POA: Diagnosis not present

## 2022-08-19 DIAGNOSIS — I2583 Coronary atherosclerosis due to lipid rich plaque: Secondary | ICD-10-CM

## 2022-08-19 DIAGNOSIS — I2510 Atherosclerotic heart disease of native coronary artery without angina pectoris: Secondary | ICD-10-CM | POA: Diagnosis not present

## 2022-08-19 DIAGNOSIS — I10 Essential (primary) hypertension: Secondary | ICD-10-CM

## 2022-08-19 DIAGNOSIS — L409 Psoriasis, unspecified: Secondary | ICD-10-CM | POA: Diagnosis not present

## 2022-08-19 DIAGNOSIS — E782 Mixed hyperlipidemia: Secondary | ICD-10-CM

## 2022-08-19 MED ORDER — HYDROCHLOROTHIAZIDE 12.5 MG PO TABS
12.5000 mg | ORAL_TABLET | Freq: Every day | ORAL | 3 refills | Status: AC
Start: 2022-08-19 — End: ?

## 2022-08-19 MED ORDER — SIMVASTATIN 40 MG PO TABS
40.0000 mg | ORAL_TABLET | Freq: Every day | ORAL | 3 refills | Status: AC
Start: 2022-08-19 — End: ?

## 2022-08-19 MED ORDER — KETOCONAZOLE 2 % EX SHAM: MEDICATED_SHAMPOO | CUTANEOUS | 3 refills | Status: AC

## 2022-08-19 MED ORDER — BETAMETHASONE DIPROPIONATE 0.05 % EX CREA: TOPICAL_CREAM | Freq: Two times a day (BID) | CUTANEOUS | 1 refills | Status: AC

## 2022-08-19 MED ORDER — BETAMETHASONE DIPROPIONATE 0.05 % EX LOTN: TOPICAL_LOTION | CUTANEOUS | 1 refills | Status: AC

## 2022-08-19 MED ORDER — ENALAPRIL MALEATE 10 MG PO TABS
10.0000 mg | ORAL_TABLET | Freq: Every day | ORAL | 3 refills | Status: AC
Start: 2022-08-19 — End: ?

## 2022-08-19 MED ORDER — CLOPIDOGREL BISULFATE 75 MG PO TABS: 75.0000 mg | ORAL_TABLET | Freq: Every day | ORAL | 3 refills | Status: AC

## 2022-08-19 MED ORDER — TAMSULOSIN HCL 0.4 MG PO CAPS: 0.4000 mg | ORAL_CAPSULE | Freq: Every day | ORAL | 3 refills | Status: AC

## 2022-08-19 MED ORDER — FINASTERIDE 5 MG PO TABS
5.0000 mg | ORAL_TABLET | Freq: Every day | ORAL | 3 refills | Status: AC
Start: 2022-08-19 — End: ?

## 2022-08-19 MED ORDER — METOPROLOL TARTRATE 25 MG PO TABS
25.0000 mg | ORAL_TABLET | Freq: Every day | ORAL | 3 refills | Status: AC
Start: 2022-08-19 — End: ?

## 2022-08-19 NOTE — Assessment & Plan Note (Signed)
Chronic, historic condition  Appears well managed on current regimen  Continue with current medication regimen on Metoprolol 25mg  daily, Enalapril 10mg  daily, and HCTZ 12.5mg  daily.  Labs ordered today.   Follow up in 6 months or sooner if concerns arise

## 2022-08-19 NOTE — Assessment & Plan Note (Signed)
Chronic, ongoing Currently managed with Plavix and Aspirin regimen and is taking Simvastatin 40 mg PO every day  He is followed regularly by Cardiology - continue follow up and collaboration  Continue current regimen- refills provided today Recheck lipid panel- results to dictate further management Follow up in 6 months or sooner if concerns arise

## 2022-08-19 NOTE — Assessment & Plan Note (Signed)
Chronic, historic condition Appears well managed on current regimen of Finasteride 5 mg PO every day and Tamsulonsin 0.4 mg PO every day Continue current regimen Follow up in 6 months or sooner if concerns arise

## 2022-08-19 NOTE — Assessment & Plan Note (Signed)
Chronic, historic and ongoing He is currently using ketoconazole shampoo and betamethasone cream and lotion for management Refills provided today Continue current regimen as needed Follow up in 6 months or sooner if concerns arise

## 2022-08-19 NOTE — Progress Notes (Signed)
Established Patient Office Visit  Name: Robert Zuniga   MRN: [id number]    DOB: 1955/01/22   Date:08/19/2022  Today's Provider: Jacquelin Hawking, MHS, PA-C Introduced myself to the patient as a PA-C and provided education on APPs in clinical practice.         Subjective  Chief Complaint  Chief Complaint  Patient presents with   Hyperlipidemia   Hypertension   Diabetes    HPI   HYPERTENSION / HYPERLIPIDEMIA/ CAD Satisfied with current treatment? yes Duration of hypertension: years BP monitoring frequency: not checking BP range:  BP medication side effects: no Past BP meds: HCTZ, Metoprolol, Enalapril  Duration of hyperlipidemia: years Cholesterol medication side effects: no Cholesterol supplements: none Past cholesterol medications: simvastatin (zocor) Medication compliance: good compliance Aspirin: yes Recent stressors: no Recurrent headaches: no Visual changes: no Palpitations: no Dyspnea: no Chest pain: no Lower extremity edema: no Dizzy/lightheaded: no  Prediabetes Currently managed with lifestyle  Recheck A1c today   BPH Currently taking Tamsulosin  Does not report current issues with urination: denies weak urinary stream, stop and start, having to push or strain to urinate  Waking up roughly 2 times per night to use restroom      Patient Active Problem List   Diagnosis Date Noted   Trigger finger of right hand 07/03/2021   IFG (impaired fasting glucose) 04/29/2019   S/P TURP 11/06/2017   S/P coronary artery stent placement 09/24/2017   Purpura senilis (HCC) 06/17/2017   BPH (benign prostatic hyperplasia) 11/06/2015   Hyperlipidemia    Hypertension    CAD (coronary artery disease)    Psoriasis     Past Surgical History:  Procedure Laterality Date   CORONARY ANGIOPLASTY WITH STENT PLACEMENT     CYSTOSTOMY W/ BLADDER BIOPSY     JOINT REPLACEMENT Bilateral    hips   PROSTATE SURGERY  2015   polyps -resolved    Family History   Problem Relation Age of Onset   Diabetes Mother    Dementia Mother    Heart disease Father    Cancer Sister    Liver disease Sister    Heart disease Maternal Grandfather    Heart disease Paternal Grandfather     Social History   Tobacco Use   Smoking status: Former    Current packs/day: 0.00    Average packs/day: 1 pack/day for 2.0 years (2.0 ttl pk-yrs)    Types: Cigarettes    Start date: 09/11/1985    Quit date: 09/12/1987    Years since quitting: 34.9   Smokeless tobacco: Never  Substance Use Topics   Alcohol use: No    Alcohol/week: 0.0 standard drinks of alcohol     Current Outpatient Medications:    aspirin EC 81 MG tablet, Take 81 mg by mouth daily., Disp: , Rfl:    betamethasone dipropionate 0.05 % cream, Apply topically 2 (two) times daily., Disp: 45 g, Rfl: 1   betamethasone dipropionate 0.05 % lotion, Apply to affected area every 12 hours as needed., Disp: 60 mL, Rfl: 1   calcium & magnesium carbonates (MYLANTA) 311-232 MG tablet, Take 1 tablet by mouth daily., Disp: , Rfl:    clopidogrel (PLAVIX) 75 MG tablet, Take 1 tablet (75 mg total) by mouth daily., Disp: 90 tablet, Rfl: 3   enalapril (VASOTEC) 10 MG tablet, Take 1 tablet (10 mg total) by mouth daily., Disp: 90 tablet, Rfl: 3   finasteride (PROSCAR) 5 MG tablet, Take 1  tablet (5 mg total) by mouth daily., Disp: 90 tablet, Rfl: 3   hydrochlorothiazide (HYDRODIURIL) 12.5 MG tablet, Take 1 tablet (12.5 mg total) by mouth daily., Disp: 90 tablet, Rfl: 3   ketoconazole (NIZORAL) 2 % shampoo, USE 1 APPLICATION TOPICALLY TWICE A WEEK. Strength: 2 %, Disp: 120 mL, Rfl: 3   metoprolol tartrate (LOPRESSOR) 25 MG tablet, Take 1 tablet (25 mg total) by mouth daily., Disp: 90 tablet, Rfl: 3   simvastatin (ZOCOR) 40 MG tablet, Take 1 tablet (40 mg total) by mouth daily., Disp: 90 tablet, Rfl: 3   tamsulosin (FLOMAX) 0.4 MG CAPS capsule, Take 1 capsule (0.4 mg total) by mouth daily., Disp: 90 capsule, Rfl: 3  Allergies   Allergen Reactions   Morphine Nausea Only and Nausea And Vomiting   Haemophilus Influenzae Other (See Comments)    Joint pain Joint pain   Influenza Vaccine Recombinant Other (See Comments)    Joint pain   Levaquin [Levofloxacin In D5w] Nausea And Vomiting   Niaspan [Niacin Er] Hives   Pneumovax [Pneumococcal Polysaccharide Vaccine] Other (See Comments)    Joint pain    I personally reviewed active problem list, medication list, allergies, health maintenance, notes from last encounter, lab results with the patient/caregiver today.   Review of Systems  Eyes:  Negative for blurred vision and double vision.  Respiratory:  Negative for shortness of breath and wheezing.   Cardiovascular:  Negative for chest pain, palpitations and leg swelling.  Genitourinary:  Negative for dysuria and frequency.  Neurological:  Negative for dizziness, tingling, tremors and headaches.      Objective  Vitals:   08/19/22 0850  BP: 124/74  Pulse: (!) 54  SpO2: 98%  Weight: 178 lb 6.4 oz (80.9 kg)  Height: 5\' 8"  (1.727 m)    Body mass index is 27.13 kg/m.  Physical Exam Vitals reviewed.  Constitutional:      General: He is awake.     Appearance: Normal appearance. He is well-developed and well-groomed.  HENT:     Head: Normocephalic and atraumatic.  Cardiovascular:     Rate and Rhythm: Normal rate and regular rhythm.     Pulses: Normal pulses.          Radial pulses are 2+ on the right side and 2+ on the left side.     Heart sounds: Normal heart sounds. No murmur heard.    No friction rub. No gallop.  Pulmonary:     Effort: Pulmonary effort is normal.     Breath sounds: Normal breath sounds. No decreased air movement. No decreased breath sounds, wheezing, rhonchi or rales.  Musculoskeletal:     Right lower leg: No edema.     Left lower leg: No edema.  Neurological:     General: No focal deficit present.     Mental Status: He is alert and oriented to person, place, and time. Mental  status is at baseline.     Cranial Nerves: No dysarthria or facial asymmetry.  Psychiatric:        Attention and Perception: Attention and perception normal.        Mood and Affect: Mood and affect normal.        Speech: Speech normal.        Behavior: Behavior normal. Behavior is cooperative.        Thought Content: Thought content normal.        Judgment: Judgment normal.      No results found for this or any  previous visit (from the past 2160 hour(s)).   PHQ2/9:    08/19/2022    8:55 AM 05/06/2022    9:15 AM 03/31/2022    8:46 AM 02/12/2022    8:10 AM 08/12/2021    8:19 AM  Depression screen PHQ 2/9  Decreased Interest 0 0 0 0 0  Down, Depressed, Hopeless 0 0 0 0 0  PHQ - 2 Score 0 0 0 0 0  Altered sleeping 0 1 0 0 0  Tired, decreased energy 0 2 0 0 0  Change in appetite 0 0 1 0 0  Feeling bad or failure about yourself  0 0 1 0 0  Trouble concentrating 0 0 0 0 0  Moving slowly or fidgety/restless 0 0 0 0 0  Suicidal thoughts 0 0 0 0 0  PHQ-9 Score 0 3 2 0 0  Difficult doing work/chores Not difficult at all Very difficult Not difficult at all Not difficult at all Not difficult at all      Fall Risk:    08/19/2022    8:55 AM 05/06/2022    9:15 AM 03/31/2022    8:49 AM 02/12/2022    8:08 AM 08/12/2021    8:19 AM  Fall Risk   Falls in the past year? 0 0 0 0 0  Number falls in past yr: 0 0 0 0 0  Injury with Fall? 0 0 0 0 0  Risk for fall due to : No Fall Risks No Fall Risks No Fall Risks No Fall Risks No Fall Risks  Follow up Falls evaluation completed Falls evaluation completed Falls prevention discussed;Falls evaluation completed Falls evaluation completed Falls evaluation completed      Functional Status Survey:      Assessment & Plan  Problem List Items Addressed This Visit       Cardiovascular and Mediastinum   Hypertension    Chronic, historic condition  Appears well managed on current regimen  Continue with current medication regimen on Metoprolol  25mg  daily, Enalapril 10mg  daily, and HCTZ 12.5mg  daily.  Labs ordered today.   Follow up in 6 months or sooner if concerns arise         Relevant Medications   simvastatin (ZOCOR) 40 MG tablet   metoprolol tartrate (LOPRESSOR) 25 MG tablet   hydrochlorothiazide (HYDRODIURIL) 12.5 MG tablet   enalapril (VASOTEC) 10 MG tablet   Other Relevant Orders   Comp Met (CMET)   CBC w/Diff   CAD (coronary artery disease)    Chronic, ongoing Currently managed with Plavix and Aspirin regimen and is taking Simvastatin 40 mg PO every day  He is followed regularly by Cardiology - continue follow up and collaboration  Continue current regimen- refills provided today Recheck lipid panel- results to dictate further management Follow up in 6 months or sooner if concerns arise        Relevant Medications   clopidogrel (PLAVIX) 75 MG tablet   simvastatin (ZOCOR) 40 MG tablet   metoprolol tartrate (LOPRESSOR) 25 MG tablet   hydrochlorothiazide (HYDRODIURIL) 12.5 MG tablet   enalapril (VASOTEC) 10 MG tablet   Other Relevant Orders   Lipid Profile     Endocrine   IFG (impaired fasting glucose) - Primary    Chronic, ongoing Patient likely meets criteria for prediabetes based on previous A1c results Will recheck CMP, A1c today -results to dictate further management Follow up in 6 months or sooner if concerns arise        Relevant Orders  HgB A1c     Musculoskeletal and Integument   Psoriasis    Chronic, historic and ongoing He is currently using ketoconazole shampoo and betamethasone cream and lotion for management Refills provided today Continue current regimen as needed Follow up in 6 months or sooner if concerns arise          Genitourinary   BPH (benign prostatic hyperplasia)    Chronic, historic condition Appears well managed on current regimen of Finasteride 5 mg PO every day and Tamsulonsin 0.4 mg PO every day Continue current regimen Follow up in 6 months or sooner if  concerns arise        Relevant Medications   finasteride (PROSCAR) 5 MG tablet   tamsulosin (FLOMAX) 0.4 MG CAPS capsule     Other   Hyperlipidemia    Chronic, historic condition Appears well managed on current regimen comprised of Simvastatin 40 mg PO every day  Continue current regimen pending labs Repeat lipid panel today- results to dictate further management Follow up in 6 months or sooner if concerns arise        Relevant Medications   simvastatin (ZOCOR) 40 MG tablet   metoprolol tartrate (LOPRESSOR) 25 MG tablet   hydrochlorothiazide (HYDRODIURIL) 12.5 MG tablet   enalapril (VASOTEC) 10 MG tablet   Other Relevant Orders   Lipid Profile     Return in about 6 months (around 02/19/2023) for Annual phys, labs and chronic follow up .   I, Aubra Pappalardo E Charlann Wayne, PA-C, have reviewed all documentation for this visit. The documentation on 08/19/22 for the exam, diagnosis, procedures, and orders are all accurate and complete.   Jacquelin Hawking, MHS, PA-C [facility]

## 2022-08-19 NOTE — Assessment & Plan Note (Signed)
Chronic, ongoing Patient likely meets criteria for prediabetes based on previous A1c results Will recheck CMP, A1c today -results to dictate further management Follow up in 6 months or sooner if concerns arise

## 2022-08-19 NOTE — Assessment & Plan Note (Signed)
Chronic, historic condition Appears well managed on current regimen comprised of Simvastatin 40 mg PO every day  Continue current regimen pending labs Repeat lipid panel today- results to dictate further management Follow up in 6 months or sooner if concerns arise

## 2022-08-22 NOTE — Progress Notes (Signed)
Your Cholesterol looks great at this time  Your electrolytes, liver and kidney function are in normal limits Your A1c has increased to 6.6 which is in diabetic range. Medications are not indicated at this time but I do recommend decreasing your intake of excess sugar and carbohydrates and exercising more each week to prevent further progression.

## 2022-08-29 DIAGNOSIS — E782 Mixed hyperlipidemia: Secondary | ICD-10-CM | POA: Diagnosis not present

## 2022-08-29 DIAGNOSIS — Z125 Encounter for screening for malignant neoplasm of prostate: Secondary | ICD-10-CM | POA: Diagnosis not present

## 2022-08-29 DIAGNOSIS — Z0000 Encounter for general adult medical examination without abnormal findings: Secondary | ICD-10-CM | POA: Diagnosis not present

## 2022-08-29 DIAGNOSIS — E1151 Type 2 diabetes mellitus with diabetic peripheral angiopathy without gangrene: Secondary | ICD-10-CM | POA: Diagnosis not present

## 2022-11-06 DIAGNOSIS — E782 Mixed hyperlipidemia: Secondary | ICD-10-CM | POA: Diagnosis not present

## 2022-11-06 DIAGNOSIS — I10 Essential (primary) hypertension: Secondary | ICD-10-CM | POA: Diagnosis not present

## 2022-11-06 DIAGNOSIS — I2510 Atherosclerotic heart disease of native coronary artery without angina pectoris: Secondary | ICD-10-CM | POA: Diagnosis not present

## 2022-11-06 DIAGNOSIS — Z955 Presence of coronary angioplasty implant and graft: Secondary | ICD-10-CM | POA: Diagnosis not present

## 2022-12-23 DIAGNOSIS — E538 Deficiency of other specified B group vitamins: Secondary | ICD-10-CM | POA: Diagnosis not present

## 2022-12-23 DIAGNOSIS — Z125 Encounter for screening for malignant neoplasm of prostate: Secondary | ICD-10-CM | POA: Diagnosis not present

## 2022-12-23 DIAGNOSIS — E1151 Type 2 diabetes mellitus with diabetic peripheral angiopathy without gangrene: Secondary | ICD-10-CM | POA: Diagnosis not present

## 2022-12-30 DIAGNOSIS — E782 Mixed hyperlipidemia: Secondary | ICD-10-CM | POA: Diagnosis not present

## 2022-12-30 DIAGNOSIS — E538 Deficiency of other specified B group vitamins: Secondary | ICD-10-CM | POA: Diagnosis not present

## 2022-12-30 DIAGNOSIS — E1151 Type 2 diabetes mellitus with diabetic peripheral angiopathy without gangrene: Secondary | ICD-10-CM | POA: Diagnosis not present

## 2023-02-11 ENCOUNTER — Ambulatory Visit: Payer: No Typology Code available for payment source | Admitting: Dermatology

## 2023-02-19 ENCOUNTER — Encounter: Payer: No Typology Code available for payment source | Admitting: Nurse Practitioner

## 2023-04-01 DIAGNOSIS — R051 Acute cough: Secondary | ICD-10-CM | POA: Diagnosis not present

## 2023-04-01 DIAGNOSIS — J019 Acute sinusitis, unspecified: Secondary | ICD-10-CM | POA: Diagnosis not present

## 2023-04-01 DIAGNOSIS — Z03818 Encounter for observation for suspected exposure to other biological agents ruled out: Secondary | ICD-10-CM | POA: Diagnosis not present

## 2023-04-01 DIAGNOSIS — J40 Bronchitis, not specified as acute or chronic: Secondary | ICD-10-CM | POA: Diagnosis not present

## 2023-05-07 DIAGNOSIS — Z955 Presence of coronary angioplasty implant and graft: Secondary | ICD-10-CM | POA: Diagnosis not present

## 2023-05-07 DIAGNOSIS — E1151 Type 2 diabetes mellitus with diabetic peripheral angiopathy without gangrene: Secondary | ICD-10-CM | POA: Diagnosis not present

## 2023-05-07 DIAGNOSIS — E782 Mixed hyperlipidemia: Secondary | ICD-10-CM | POA: Diagnosis not present

## 2023-05-07 DIAGNOSIS — I10 Essential (primary) hypertension: Secondary | ICD-10-CM | POA: Diagnosis not present

## 2023-05-07 DIAGNOSIS — I2510 Atherosclerotic heart disease of native coronary artery without angina pectoris: Secondary | ICD-10-CM | POA: Diagnosis not present

## 2023-06-24 DIAGNOSIS — E538 Deficiency of other specified B group vitamins: Secondary | ICD-10-CM | POA: Diagnosis not present

## 2023-06-24 DIAGNOSIS — E1151 Type 2 diabetes mellitus with diabetic peripheral angiopathy without gangrene: Secondary | ICD-10-CM | POA: Diagnosis not present

## 2023-07-01 DIAGNOSIS — Z79899 Other long term (current) drug therapy: Secondary | ICD-10-CM | POA: Diagnosis not present

## 2023-07-01 DIAGNOSIS — E538 Deficiency of other specified B group vitamins: Secondary | ICD-10-CM | POA: Diagnosis not present

## 2023-07-01 DIAGNOSIS — Z0000 Encounter for general adult medical examination without abnormal findings: Secondary | ICD-10-CM | POA: Diagnosis not present

## 2023-07-01 DIAGNOSIS — L820 Inflamed seborrheic keratosis: Secondary | ICD-10-CM | POA: Diagnosis not present

## 2023-07-01 DIAGNOSIS — Z125 Encounter for screening for malignant neoplasm of prostate: Secondary | ICD-10-CM | POA: Diagnosis not present

## 2023-07-01 DIAGNOSIS — E1151 Type 2 diabetes mellitus with diabetic peripheral angiopathy without gangrene: Secondary | ICD-10-CM | POA: Diagnosis not present

## 2023-09-28 ENCOUNTER — Other Ambulatory Visit: Payer: Self-pay | Admitting: Physician Assistant

## 2023-09-29 NOTE — Telephone Encounter (Signed)
 Requested medication (s) are due for refill today: yes  Requested medication (s) are on the active medication list: yes  Last refill:  08/19/22 for each lotion and cream  Future visit scheduled: no  Notes to clinic:  both meds not assigned to a protocol   Requested Prescriptions  Pending Prescriptions Disp Refills   betamethasone  dipropionate 0.05 % cream [Pharmacy Med Name: BETAMETHASONE  DP 0.05% CRM] 45 g 1    Sig: APPLY TO THE AFFECTED AREA(S) 2 TIMES A DAY     Off-Protocol Failed - 09/29/2023  2:30 PM      Failed - Medication not assigned to a protocol, review manually.      Failed - Valid encounter within last 12 months    Recent Outpatient Visits   None             betamethasone  dipropionate 0.05 % lotion [Pharmacy Med Name: BETAMETHASONE  DP 0.05% LOT] 60 mL 1    Sig: Apply to affected area every 12 hours as needed.     Off-Protocol Failed - 09/29/2023  2:30 PM      Failed - Medication not assigned to a protocol, review manually.      Failed - Valid encounter within last 12 months    Recent Outpatient Visits   None

## 2023-10-28 DIAGNOSIS — M79671 Pain in right foot: Secondary | ICD-10-CM | POA: Diagnosis not present

## 2023-10-28 DIAGNOSIS — L989 Disorder of the skin and subcutaneous tissue, unspecified: Secondary | ICD-10-CM | POA: Diagnosis not present

## 2023-10-29 DIAGNOSIS — M778 Other enthesopathies, not elsewhere classified: Secondary | ICD-10-CM | POA: Diagnosis not present

## 2023-10-29 DIAGNOSIS — M2011 Hallux valgus (acquired), right foot: Secondary | ICD-10-CM | POA: Diagnosis not present

## 2023-10-29 DIAGNOSIS — R7303 Prediabetes: Secondary | ICD-10-CM | POA: Diagnosis not present

## 2023-10-29 DIAGNOSIS — M2012 Hallux valgus (acquired), left foot: Secondary | ICD-10-CM | POA: Diagnosis not present
